# Patient Record
Sex: Female | Born: 1980 | Race: White | Hispanic: No | Smoking: Current every day smoker
Health system: Southern US, Community
[De-identification: ages and names within clinical notes are randomized; demographics above are authoritative.]

## PROBLEM LIST (undated history)

## (undated) DIAGNOSIS — F419 Anxiety disorder, unspecified: Secondary | ICD-10-CM

---

## 2001-07-25 ENCOUNTER — Other Ambulatory Visit: Admission: RE | Admit: 2001-07-25 | Discharge: 2001-07-25 | Payer: Self-pay | Admitting: Obstetrics and Gynecology

## 2003-01-20 ENCOUNTER — Encounter: Payer: Self-pay | Admitting: *Deleted

## 2003-01-20 ENCOUNTER — Emergency Department (HOSPITAL_COMMUNITY): Admission: EM | Admit: 2003-01-20 | Discharge: 2003-01-20 | Payer: Self-pay | Admitting: *Deleted

## 2003-06-29 ENCOUNTER — Inpatient Hospital Stay (HOSPITAL_COMMUNITY): Admission: EM | Admit: 2003-06-29 | Discharge: 2003-07-01 | Payer: Self-pay | Admitting: Psychiatry

## 2006-01-22 ENCOUNTER — Inpatient Hospital Stay (HOSPITAL_COMMUNITY): Admission: AD | Admit: 2006-01-22 | Discharge: 2006-01-26 | Payer: Self-pay | Admitting: Obstetrics and Gynecology

## 2006-01-23 ENCOUNTER — Encounter (INDEPENDENT_AMBULATORY_CARE_PROVIDER_SITE_OTHER): Payer: Self-pay | Admitting: Specialist

## 2006-01-29 ENCOUNTER — Ambulatory Visit: Admission: RE | Admit: 2006-01-29 | Discharge: 2006-01-29 | Payer: Self-pay | Admitting: Obstetrics and Gynecology

## 2010-05-10 ENCOUNTER — Ambulatory Visit (HOSPITAL_COMMUNITY): Admission: RE | Admit: 2010-05-10 | Discharge: 2010-05-10 | Payer: Self-pay | Admitting: Internal Medicine

## 2010-11-25 NOTE — Op Note (Signed)
Paula Paula [name], Paula [name]               ACCOUNT NO.:  [id number]   MEDICAL RECORD NO.:  [id number]          PATIENT TYPE:  INP   LOCATION:  9126                          FACILITY:  [facility]   PHYSICIAN:  Juluis Mire, M.D.   DATE OF BIRTH:  1980/08/24   DATE OF PROCEDURE:  01/23/2006  DATE OF DISCHARGE:                                 OPERATIVE REPORT   PREOPERATIVE DIAGNOSIS:  Intrauterine pregnancy at term with  oligohydramnios.  Induction of labor.  Face presentation.   POSTOPERATIVE DIAGNOSIS:  Intrauterine pregnancy at term with  oligohydramnios.  Induction of labor.  Face presentation.   OPERATION PERFORMED:  Low transverse cesarean section.   SURGEON:  Juluis Mire, M.D.   ANESTHESIA:  Spinal.   ESTIMATED BLOOD LOSS:  500 mL.   PACKS AND DRAINS:  None.   INTRAOPERATIVE BLOOD REPLACEMENT:  None.   COMPLICATIONS:  None.   INDICATIONS FOR PROCEDURE:  The patient is a 30 year old prima gravida  female at 40+ weeks, presented to the office.  Ultrasound revealed  oligohydramnios.  Was brought in for induction of labor.  Did have positive  group B strep.  Progressed to approximately 2 to 3 cm dilatation.  Found to  have a face presentation with the mentum posterior.  After discussion of  options, we decided to proceed with a primary cesarean section.  Risks were  discussed including risks of infection.  The risk of hemorrhage could  require transfusion with risks of AIDS or hepatitis.  The risk of injury to  adjacent organs including bladder, bowel or ureters that could require  further exploratory surgery.  Risks of deep venous thrombosis and pulmonary  embolus.  The patient understands indications and risks.   DESCRIPTION OF PROCEDURE:  The patient was taken to the operating room  placed supine position with left lateral tilt.  After satisfactory level of  spinal anesthesia was obtained, the abdomen was prepped out with Betadine  and draped in sterile field.  Low transverse  skin incision was made with a  knife and carried through subcutaneous tissue.  Fascia was entered sharply  and incision in fascia extended laterally.  Fascia taken off the muscle  superiorly and inferiorly.  Rectus muscles were separated in the midline.  The peritoneum was entered.  Incision in the peritoneum was extended both  superiorly and inferiorly.  Low transverse bladder flap was developed.  Low  transverse uterine incision begun with a knife and extended laterally using  manual traction.  The infant was in the face presentation and was delivered  with elevation of the head and fundal pressure.  Infant was a viable female.  Apgars were 8 and 9.  Umbilical artery pH was 7.31.  There were some slight  lacerations to the forehead from the amni-hook.  At this point the placenta  was delivered manually and sent for pathologic review.  It appeared to be  normal.  Uterus was exteriorized for closure.  There were no uterine  abnormalities.  Tubes and ovaries were unremarkable.  Uterus was then closed  with running locking sutures of 0  chromic using a two layered closure  technique.  We had good hemostasis and clear urine output.  The uterus was  returned to the abdominal cavity.  At this point muscle was reapproximated  with running suture of 3-0 Vicryl.  Fascia was closed with a running suture  of 0 PDS.  Skin was closed with staples and Steri-Strips.  Sponge, needle  and instrument counts were reported as correct by circulating nurse x2.  Foley catheter remained clear at the time of closure.  Patient tolerated the  procedure well and was returned to the recovery room in good condition.      Juluis Mire, M.D.  Electronically Signed     JSM/MEDQ  D:  01/23/2006  T:  01/24/2006  Job:  [id number]
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
## 2010-11-25 NOTE — Discharge Summary (Signed)
Paula [name], Paula [name]               ACCOUNT NO.:  [id number]   MEDICAL RECORD NO.:  [id number]          PATIENT TYPE:  INP   LOCATION:  9126                          FACILITY:  [facility]   PHYSICIAN:  Freddy Finner, M.D.   DATE OF BIRTH:  20-Sep-1980   DATE OF ADMISSION:  01/22/2006  DATE OF DISCHARGE:  01/26/2006                                 DISCHARGE SUMMARY   ADMITTING DIAGNOSES:  1. Intrauterine pregnancy at 40-2/7 weeks' estimated gestational age.  2. Oligohydramnios.  3. Induction of labor.   DISCHARGE DIAGNOSES:  1. Status post low transverse cesarean section secondary to face      presentation.  2. Viable female infant.   PROCEDURE:  Primary low transverse cesarean section.   REASON FOR ADMISSION:  Please see written H&P.   HOSPITAL COURSE:  The patient was a 24-year primigravida that was admitted  to [facility] at 40-2/7 weeks' estimated gestational age  for an induction of labor.  The patient had been seen in the office and had  had an ultrasound which had revealed oligohydramnios.  The patient was also  known to have positive group B beta strep.  The patient did progress to 2-  to 3-cm dilated, at which time she was found to have a face presentation  with a mentum in the posterior position.  The patient had also been  administer IV antibiotics.  Due to face presentation, decision was made to  proceed with a primary low transverse cesarean section.  The patient was  then transferred to the operating room, where spinal anesthesia was  administered without difficulty.  A low transverse incision was made with  delivery of a viable female infant weighing 6 pounds 8 ounces, Apgar scores  of 8 at 1 minute and 9 at 5 minutes.  Arterial cord pH was 7.31.  The  patient tolerated the procedure well and taken to the recovery room in  stable condition.  On postoperative day #1, the patient was without  complaint.  Vital signs were stable.  She was afebrile.  Fundus  was firm and  nontender.  Abdominal dressing was noted to be clean, dry and intact.  Laboratory findings revealed hemoglobin of 12.1, platelet count of 225,000,  WBC count of 16.9.  On postoperative day #2, the patient did complain of  some soreness.  Vital signs were stable.  She was afebrile.  Abdomen was  soft.  Fundus was firm and nontender.  Abdominal dressing had been removed,  revealing incision that was clean, dry and intact.  The patient was  ambulating well and tolerating a regular diet without complaints of nausea  or vomiting.  On postoperative day #3, the patient was without complaint.  Vital signs remained stable.  She was afebrile.  Abdomen was soft.  Fundus  was firm and nontender.  Incision was clean, dry and intact.  Discharge  instructions reviewed and the patient was discharged home.   CONDITION ON DISCHARGE:  Good.   DIET:  Regular as tolerated.   ATTENDING:  No heavy lifting.  No driving x2 weeks.  No  vaginal entry.   FOLLOWUP:  The patient is to follow up in the office in 2 weeks for an  incision check.  She is to call for temperature greater than 100 degrees,  persistent nausea and vomiting, heavy vaginal bleeding and/or redness or  drainage from the incisional site.   DISCHARGE MEDICATIONS:  1. Percocet 5/325, #30, one p.o. every 4-6 hours p.r.n..  2. Motrin 600 mg every 6 hours p.r.n.  3. Prenatal vitamins one p.o. daily.  4. Colace p.o. daily p.r.n.      Julio Sicks, N.P.      Freddy Finner, M.D.  Electronically Signed    CC/MEDQ  D:  02/25/2006  T:  02/26/2006  Job:  [id number]

## 2012-03-19 ENCOUNTER — Encounter (HOSPITAL_COMMUNITY): Payer: Self-pay

## 2012-03-19 ENCOUNTER — Emergency Department (INDEPENDENT_AMBULATORY_CARE_PROVIDER_SITE_OTHER)
Admission: EM | Admit: 2012-03-19 | Discharge: 2012-03-19 | Disposition: A | Discharge status: Home / Self Care | Payer: Self-pay | Source: Home / Self Care

## 2012-03-19 DIAGNOSIS — L03019 Cellulitis of unspecified finger: Secondary | ICD-10-CM

## 2012-03-19 DIAGNOSIS — IMO0002 Reserved for concepts with insufficient information to code with codable children: Secondary | ICD-10-CM

## 2012-03-19 HISTORY — DX: Anxiety disorder, unspecified: F41.9

## 2012-03-19 MED ORDER — OXYCODONE-ACETAMINOPHEN 5-325 MG PO TABS: 1.0000 | ORAL_TABLET | Freq: Four times a day (QID) | ORAL | Status: AC | PRN

## 2012-03-19 MED ORDER — DOXYCYCLINE HYCLATE 100 MG PO CAPS: 100.0000 mg | ORAL_CAPSULE | Freq: Two times a day (BID) | ORAL | Status: AC

## 2012-03-19 MED ORDER — HYDROCODONE-ACETAMINOPHEN 5-325 MG PO TABS: 1.0000 | ORAL_TABLET | ORAL | Status: DC | PRN
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
## 2012-03-19 NOTE — ED Notes (Signed)
Rt middle finger is swollen, red and has yellow pus noted to nail area.

## 2012-03-21 ENCOUNTER — Emergency Department (HOSPITAL_COMMUNITY): Payer: Self-pay

## 2012-03-21 ENCOUNTER — Encounter (HOSPITAL_COMMUNITY): Payer: Self-pay | Admitting: Emergency Medicine

## 2012-03-21 ENCOUNTER — Emergency Department (HOSPITAL_COMMUNITY)
Admission: EM | Admit: 2012-03-21 | Discharge: 2012-03-21 | Discharge status: Against Medical Advice | Payer: Self-pay | Attending: Emergency Medicine | Admitting: Emergency Medicine

## 2012-03-21 DIAGNOSIS — IMO0002 Reserved for concepts with insufficient information to code with codable children: Secondary | ICD-10-CM | POA: Insufficient documentation

## 2012-03-21 DIAGNOSIS — F411 Generalized anxiety disorder: Secondary | ICD-10-CM | POA: Insufficient documentation

## 2012-03-21 DIAGNOSIS — F17200 Nicotine dependence, unspecified, uncomplicated: Secondary | ICD-10-CM | POA: Insufficient documentation
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
## 2012-03-23 LAB — CULTURE, ROUTINE-ABSCESS

## 2012-03-23 NOTE — ED Provider Notes (Signed)
Medical screening examination/treatment/procedure(s) were performed by resident physician or non-physician practitioner and as supervising physician I was immediately available for consultation/collaboration.   Barkley Bruns MD.    Linna Hoff, MD 03/23/12 1052

## 2014-09-24 ENCOUNTER — Ambulatory Visit (INDEPENDENT_AMBULATORY_CARE_PROVIDER_SITE_OTHER): Payer: Self-pay | Admitting: Family Medicine

## 2014-09-24 ENCOUNTER — Ambulatory Visit (INDEPENDENT_AMBULATORY_CARE_PROVIDER_SITE_OTHER): Payer: Self-pay

## 2014-09-24 VITALS — BP 110/64 | HR 109 | Temp 102.3°F | Resp 18 | Ht 65.0 in | Wt 140.0 lb

## 2014-09-24 DIAGNOSIS — R509 Fever, unspecified: Secondary | ICD-10-CM

## 2014-09-24 DIAGNOSIS — R05 Cough: Secondary | ICD-10-CM

## 2014-09-24 DIAGNOSIS — R059 Cough, unspecified: Secondary | ICD-10-CM

## 2014-09-24 DIAGNOSIS — J22 Unspecified acute lower respiratory infection: Secondary | ICD-10-CM

## 2014-09-24 DIAGNOSIS — Z720 Tobacco use: Secondary | ICD-10-CM

## 2014-09-24 DIAGNOSIS — R6889 Other general symptoms and signs: Secondary | ICD-10-CM

## 2014-09-24 DIAGNOSIS — J988 Other specified respiratory disorders: Secondary | ICD-10-CM

## 2014-09-24 MED ORDER — ALBUTEROL SULFATE HFA 108 (90 BASE) MCG/ACT IN AERS: 2.0000 | INHALATION_SPRAY | Freq: Four times a day (QID) | RESPIRATORY_TRACT | Status: AC | PRN

## 2014-09-24 MED ORDER — HYDROCODONE-HOMATROPINE 5-1.5 MG/5ML PO SYRP: 5.0000 mL | ORAL_SOLUTION | Freq: Every evening | ORAL | Status: DC | PRN

## 2014-09-24 MED ORDER — OSELTAMIVIR PHOSPHATE 75 MG PO CAPS: 75.0000 mg | ORAL_CAPSULE | Freq: Two times a day (BID) | ORAL | Status: DC

## 2014-09-24 MED ORDER — ACETAMINOPHEN 325 MG PO TABS
500.0000 mg | ORAL_TABLET | Freq: Once | ORAL | Status: AC
  Administered 2014-09-24: 500 mg via ORAL

## 2014-09-24 MED ORDER — AZITHROMYCIN 250 MG PO TABS: ORAL_TABLET | ORAL | Status: DC

## 2014-09-24 NOTE — Progress Notes (Signed)
Chief Complaint:  Chief Complaint  Patient presents with  . Fever  . Headache  . Shortness of Breath  . Generalized Body Aches    HPI: Paula [name] is a 34 y.o. female who is here for  3-5 day progression of worsening lower respiratory and upper respiratory symptoms, she has been having a cough, nasal congestion generalized body aches, headache along the temporal area, she had a fever last night, T max was 102, Taking ibuprofen and tylenol. This morning did not feel good, total lethargy, she has had poor by mouth intake. She has had some nausea. Denies any abdominal pain or diarrhea or rash. Denies any ear pain. She is a smoker, but has not been able to smoke during this illness except for one cigarette a day. She has had some shortness of breath, denies any wheezing or chest pain. Denies sore throat.  Past Medical History  Diagnosis Date  . Anxiety    Past Surgical History  Procedure Laterality Date  . Cesarean section     History   Social History  . Marital Status: Divorced    Spouse Name: N/A  . Number of Children: N/A  . Years of Education: N/A   Social History Main Topics  . Smoking status: Current Every Day Smoker -- 1.00 packs/day    Types: Cigarettes  . Smokeless tobacco: Not on file  . Alcohol Use: Yes     Comment: occasion  . Drug Use: No  . Sexual Activity: Not on file   Other Topics Concern  . None   Social History Narrative   No family history on file. No Known Allergies Prior to Admission medications   Medication Sig Start Date End Date Taking? Authorizing Provider  clonazePAM (KLONOPIN) 0.5 MG tablet Take 0.5 mg by mouth 3 (three) times daily as needed.    Historical Provider, MD  ibuprofen (ADVIL,MOTRIN) 200 MG tablet Take 800 mg by mouth every 6 (six) hours as needed. For paion    Historical Provider, MD     ROS: The patient denies  night sweats, unintentional weight loss, chest pain, palpitations, wheezing, dyspnea on exertion, nausea,  vomiting, abdominal pain, dysuria, hematuria, melena, numbness, or tingling.   All other systems have been reviewed and were otherwise negative with the exception of those mentioned in the HPI and as above.    PHYSICAL EXAM: Filed Vitals:   09/24/14 1805  BP: 110/64  Pulse: 109  Temp: 102.3 F (39.1 C)  Resp: 18   Filed Vitals:   09/24/14 1805  Height: 5\' 5"  (1.651 m)  Weight: 140 lb (63.504 kg)   Body mass index is 23.3 kg/(m^2).  General: Alert, no acute distress, tired appearing Caucasian female, all mucosa is slightly dry HEENT:  Normocephalic, atraumatic, oropharynx patent. EOMI, PERRLA Cardiovascular:  Regular rate and rhythm, no rubs murmurs or gallops.  Radial pulse intact. No pedal edema.  Respiratory: wheezes, rales,  + rhonchi.  No cyanosis, no use of accessory musculature GI: No organomegaly, abdomen is soft and non-tender, positive bowel sounds.  No masses. Skin: No rashes. Neurologic: Facial musculature symmetric. Psychiatric: Patient is appropriate throughout our interaction. Lymphatic: No cervical lymphadenopathy Musculoskeletal: Gait intact.   LABS: Results for orders placed or performed during the hospital encounter of 03/19/12  Culture, routine-abscess  Result Value Ref Range   Specimen Description ABSCESS FINGER RIGHT    Special Requests NONE    Gram Stain      FEW WBC PRESENT,BOTH PMN  AND MONONUCLEAR NO SQUAMOUS EPITHELIAL CELLS SEEN RARE GRAM NEGATIVE RODS   Culture      MULTIPLE ORGANISMS PRESENT, NONE PREDOMINANT Note: NO STAPHYLOCOCCUS AUREUS ISOLATED NO GROUP A STREP (S.PYOGENES) ISOLATED   Report Status 03/23/2012 FINAL      EKG/XRAY:   Primary read interpreted by Dr. Conley Rolls at [facility]. Questionable right middle lobe infiltrate, there is a patchy density on lateral view as well.    ASSESSMENT/PLAN: Encounter Diagnoses  Name Primary?  . Fever, unspecified fever cause   . Cough Yes  . Tobacco abuse   . Flu-like symptoms   . Lower  respiratory infection (e.g., bronchitis, pneumonia, pneumonitis, pulmonitis)    Pleasant 34 year old female who has a history of tobacco abuse who presents with flulike symptoms and also changes on x-ray consistent with bronchitis vs possible early  Pneumonia. He was given 1 dose of Tylenol 500 mg in the office and her temperature went down to 101.5 from 102. Advised to push fluids at home and also take alternating Tylenol and Motrin when necessary Will treat for Lower respiratory sxs c/w bronchitis  with Z-Pak Also has flu like sxs with treat with tamiflu Rx  Hycodan; take OTC Tildon Husky As she is uninsured so we did not to the flu swab instead just did a chest x-ray. Fu prn   Gross sideeffects, risk and benefits, and alternatives of medications d/w patient. Patient is aware that all medications have potential sideeffects and we are unable to predict every sideeffect or drug-drug interaction that may occur.  Hamilton Capri PHUONG, DO 09/24/2014 6:49 PM

## 2014-10-06 ENCOUNTER — Encounter: Payer: Self-pay | Admitting: Radiology

## 2014-10-17 ENCOUNTER — Telehealth: Payer: Self-pay | Admitting: *Deleted

## 2014-10-17 NOTE — Telephone Encounter (Signed)
Pt called regarding X-ray results.  Gave pt results

## 2015-12-01 ENCOUNTER — Ambulatory Visit (HOSPITAL_COMMUNITY)
Admission: EM | Admit: 2015-12-01 | Discharge: 2015-12-01 | Disposition: A | Discharge status: Home / Self Care | Payer: Medicaid Other | Attending: Family Medicine | Admitting: Family Medicine

## 2015-12-01 ENCOUNTER — Encounter (HOSPITAL_COMMUNITY): Payer: Self-pay | Admitting: Emergency Medicine

## 2015-12-01 DIAGNOSIS — J302 Other seasonal allergic rhinitis: Secondary | ICD-10-CM | POA: Diagnosis not present

## 2015-12-01 DIAGNOSIS — R0982 Postnasal drip: Secondary | ICD-10-CM | POA: Insufficient documentation

## 2015-12-01 DIAGNOSIS — H6691 Otitis media, unspecified, right ear: Secondary | ICD-10-CM | POA: Insufficient documentation

## 2015-12-01 DIAGNOSIS — R200 Anesthesia of skin: Secondary | ICD-10-CM | POA: Diagnosis not present

## 2015-12-01 DIAGNOSIS — F17210 Nicotine dependence, cigarettes, uncomplicated: Secondary | ICD-10-CM | POA: Diagnosis not present

## 2015-12-01 DIAGNOSIS — J029 Acute pharyngitis, unspecified: Secondary | ICD-10-CM | POA: Insufficient documentation

## 2015-12-01 DIAGNOSIS — F419 Anxiety disorder, unspecified: Secondary | ICD-10-CM | POA: Insufficient documentation

## 2015-12-01 LAB — POCT RAPID STREP A: Streptococcus, Group A Screen (Direct): NEGATIVE

## 2015-12-01 MED ORDER — AMOXICILLIN 500 MG PO CAPS: 1000.0000 mg | ORAL_CAPSULE | Freq: Two times a day (BID) | ORAL | Status: DC

## 2015-12-01 MED ORDER — IBUPROFEN 800 MG PO TABS: 800.0000 mg | ORAL_TABLET | Freq: Three times a day (TID) | ORAL | Status: DC

## 2015-12-01 NOTE — ED Notes (Signed)
[facility] with [facility] complaint of a headache, numbness in her right arm, and bilateral ear pain. The patient's husband stated that over the last month she has had extreme fatigue and lower leg circulation issues for the last month.

## 2015-12-01 NOTE — Discharge Instructions (Signed)
Allergic Rhinitis For nasal and head congestion may take Sudafed PE 10 mg every 4 hours as needed. Saline nasal spray used frequently. For drainage may use Allegra, Claritin or Zyrtec. If you need stronger medicine to stop drainage may take Chlor-Trimeton 2-4 mg every 4 hours. This may cause drowsiness. Ibuprofen 600 mg every 6 hours as needed for pain, discomfort or fever. Drink plenty of fluids and stay well-hydrated.  Allergic rhinitis is when the mucous membranes in the nose respond to allergens. Allergens are particles in the air that cause your body to have an allergic reaction. This causes you to release allergic antibodies. Through a chain of events, these eventually cause you to release histamine into the blood stream. Although meant to protect the body, it is this release of histamine that causes your discomfort, such as frequent sneezing, congestion, and an itchy, runny nose.  CAUSES Seasonal allergic rhinitis (hay fever) is caused by pollen allergens that may come from grasses, trees, and weeds. Year-round allergic rhinitis (perennial allergic rhinitis) is caused by allergens such as house dust mites, pet dander, and mold spores. SYMPTOMS  Nasal stuffiness (congestion).  Itchy, runny nose with sneezing and tearing of the eyes. DIAGNOSIS Your health care provider can help you determine the allergen or allergens that trigger your symptoms. If you and your health care provider are unable to determine the allergen, skin or blood testing may be used. Your health care provider will diagnose your condition after taking your health history and performing a physical exam. Your health care provider may assess you for other related conditions, such as asthma, pink eye, or an ear infection. TREATMENT Allergic rhinitis does not have a cure, but it can be controlled by:  Medicines that block allergy symptoms. These may include allergy shots, nasal sprays, and oral antihistamines.  Avoiding the  allergen. Hay fever may often be treated with antihistamines in pill or nasal spray forms. Antihistamines block the effects of histamine. There are over-the-counter medicines that may help with nasal congestion and swelling around the eyes. Check with your health care provider before taking or giving this medicine. If avoiding the allergen or the medicine prescribed do not work, there are many new medicines your health care provider can prescribe. Stronger medicine may be used if initial measures are ineffective. Desensitizing injections can be used if medicine and avoidance does not work. Desensitization is when a patient is given ongoing shots until the body becomes less sensitive to the allergen. Make sure you follow up with your health care provider if problems continue. HOME CARE INSTRUCTIONS It is not possible to completely avoid allergens, but you can reduce your symptoms by taking steps to limit your exposure to them. It helps to know exactly what you are allergic to so that you can avoid your specific triggers. SEEK MEDICAL CARE IF:  You have a fever.  You develop a cough that does not stop easily (persistent).  You have shortness of breath.  You start wheezing.  Symptoms interfere with normal daily activities.   This information is not intended to replace advice given to you by your health care provider. Make sure you discuss any questions you have with your health care provider.   Document Released: 03/21/2001 Document Revised: 07/17/2014 Document Reviewed: 03/03/2013 Elsevier Interactive Patient Education 2016 [employer].  Otitis Media, Adult Otitis media is redness, soreness, and inflammation of the middle ear. Otitis media may be caused by allergies or, most commonly, by infection. Often it occurs as a complication  of the common cold. SIGNS AND SYMPTOMS Symptoms of otitis media may include:  Earache.  Fever.  Ringing in your ear.  Headache.  Leakage of fluid from  the ear. DIAGNOSIS To diagnose otitis media, your health care provider will examine your ear with an otoscope. This is an instrument that allows your health care provider to see into your ear in order to examine your eardrum. Your health care provider also will ask you questions about your symptoms. TREATMENT  Typically, otitis media resolves on its own within 3-5 days. Your health care provider may prescribe medicine to ease your symptoms of pain. If otitis media does not resolve within 5 days or is recurrent, your health care provider may prescribe antibiotic medicines if he or she suspects that a bacterial infection is the cause. HOME CARE INSTRUCTIONS   If you were prescribed an antibiotic medicine, finish it all even if you start to feel better.  Take medicines only as directed by your health care provider.  Keep all follow-up visits as directed by your health care provider. SEEK MEDICAL CARE IF:  You have otitis media only in one ear, or bleeding from your nose, or both.  You notice a lump on your neck.  You are not getting better in 3-5 days.  You feel worse instead of better. SEEK IMMEDIATE MEDICAL CARE IF:   You have pain that is not controlled with medicine.  You have swelling, redness, or pain around your ear or stiffness in your neck.  You notice that part of your face is paralyzed.  You notice that the bone behind your ear (mastoid) is tender when you touch it. MAKE SURE YOU:   Understand these instructions.  Will watch your condition.  Will get help right away if you are not doing well or get worse.   This information is not intended to replace advice given to you by your health care provider. Make sure you discuss any questions you have with your health care provider.   Document Released: 03/31/2004 Document Revised: 07/17/2014 Document Reviewed: 01/21/2013 Elsevier Interactive Patient Education [employer] Inc.

## 2015-12-01 NOTE — ED Provider Notes (Signed)
CSN: [id number]     Arrival date & time 12/01/15  1953 History   First MD Initiated Contact with Patient 12/01/15 2015     Chief Complaint  Patient presents with  . Headache  . Numbness  . Otalgia   (Consider location/radiation/quality/duration/timing/severity/associated sxs/prior Treatment) HPI Comments: 35 year old female presents with acute onset of sore throat, neck pain, right earache, headache and right hand numbness since last PM.  Her second concern in which her boyfriend wanted her to speak of involves swelling of the lower extremities at the end of her work shift, fatigue for a month.   Past Medical History  Diagnosis Date  . Anxiety    Past Surgical History  Procedure Laterality Date  . Cesarean section     History reviewed. No pertinent family history. Social History  Substance Use Topics  . Smoking status: Current Every Day Smoker -- 1.00 packs/day    Types: Cigarettes  . Smokeless tobacco: None  . Alcohol Use: Yes     Comment: occasion   OB History    No data available     Review of Systems  Constitutional: Positive for fever, activity change and fatigue.  HENT: Positive for congestion, postnasal drip, rhinorrhea and sore throat.   Eyes: Negative.   Respiratory: Positive for cough. Negative for shortness of breath.   Cardiovascular: Positive for leg swelling. Negative for chest pain.  Gastrointestinal: Negative.   Musculoskeletal: Negative.   Skin: Negative.   Neurological: Positive for numbness and headaches. Negative for tremors, seizures, syncope, facial asymmetry and speech difficulty.    Allergies  Review of patient's allergies indicates no known allergies.  Home Medications   Prior to Admission medications   Medication Sig Start Date End Date Taking? Authorizing Provider  clonazePAM (KLONOPIN) 0.5 MG tablet Take 0.5 mg by mouth 3 (three) times daily as needed.   Yes Historical Provider, MD  escitalopram (LEXAPRO) 10 MG tablet Take 10 mg by  mouth daily.   Yes Historical Provider, MD  albuterol (PROVENTIL HFA;VENTOLIN HFA) 108 (90 BASE) MCG/ACT inhaler Inhale 2 puffs into the lungs every 6 (six) hours as needed for wheezing or shortness of breath. 09/24/14   Thao P Le, DO  amoxicillin (AMOXIL) 500 MG capsule Take 2 capsules (1,000 mg total) by mouth 2 (two) times daily. 12/01/15   Hayden Rasmussenavid Jonanthan Bolender, NP  azithromycin (ZITHROMAX) 250 MG tablet Take 2 tabs po now then 1 tab po daily for the next 4 days 09/24/14   Thao P Le, DO  HYDROcodone-homatropine (HYCODAN) 5-1.5 MG/5ML syrup Take 5 mLs by mouth at bedtime as needed. 09/24/14   Thao P Le, DO  ibuprofen (ADVIL,MOTRIN) 200 MG tablet Take 800 mg by mouth every 6 (six) hours as needed. For paion    Historical Provider, MD  oseltamivir (TAMIFLU) 75 MG capsule Take 1 capsule (75 mg total) by mouth 2 (two) times daily. 09/24/14   Thao P Le, DO   Meds Ordered and Administered this Visit  Medications - No data to display  BP 110/63 mmHg  Pulse 87  Temp(Src) 99.6 F (37.6 C) (Oral)  Resp 18  SpO2 98%  LMP 10/09/2015 (Approximate) No data found.   Physical Exam  Constitutional: She is oriented to person, place, and time. She appears well-developed and well-nourished. No distress.  HENT:  Head: Normocephalic and atraumatic.  Mouth/Throat: No oropharyngeal exudate.  Bilateral TMs are retracted. Right TM is deeply erythematous.  Oropharynx with deep erythema, cobblestoning and clear PND.  Eyes: Conjunctivae and EOM  are normal.  Neck: Normal range of motion. Neck supple.  Tenderness to the posterior cervical musculature. Tenderness over the bridge of the trapezius and splenius Type. No spinal tenderness. No neck stiffness. Full range of motion of the neck.  Cardiovascular: Normal rate, regular rhythm and normal heart sounds.   Pulmonary/Chest: Effort normal. No respiratory distress.  Somewhat diminished breath sounds. Patient states she smokes one pack per day. No adventitious sounds. No  wheezing or crackles.  Musculoskeletal: Normal range of motion. She exhibits no edema.  Lymphadenopathy:    She has no cervical adenopathy.  Neurological: She is alert and oriented to person, place, and time. No cranial nerve deficit. She exhibits normal muscle tone. Coordination normal.  Skin: Skin is warm and dry. No rash noted.  Psychiatric: She has a normal mood and affect.  Nursing note and vitals reviewed.   ED Course  Procedures (including critical care time)  Labs Review Labs Reviewed  POCT RAPID STREP A    Imaging Review No results found.   Visual Acuity Review  Right Eye Distance:   Left Eye Distance:   Bilateral Distance:    Right Eye Near:   Left Eye Near:    Bilateral Near:         MDM   1. Acute right otitis media, recurrence not specified, unspecified otitis media type   2. Other seasonal allergic rhinitis   3. Pharyngitis   4. PND (post-nasal drip)    Meds ordered this encounter  Medications  . escitalopram (LEXAPRO) 10 MG tablet    Sig: Take 10 mg by mouth daily.  Marland Kitchen amoxicillin (AMOXIL) 500 MG capsule    Sig: Take 2 capsules (1,000 mg total) by mouth 2 (two) times daily.    Dispense:  30 capsule    Refill:  0    Order Specific Question:  Supervising Provider    Answer:  Linna Hoff [id number]   For nasal and head congestion may take Sudafed PE 10 mg every 4 hours as needed. Saline nasal spray used frequently. For drainage may use Allegra, Claritin or Zyrtec. If you need stronger medicine to stop drainage may take Chlor-Trimeton 2-4 mg every 4 hours. This may cause drowsiness. Ibuprofen 600 mg every 6 hours as needed for pain, discomfort or fever. Drink plenty of fluids and stay well-hydrated. Follow-up with your doctor as needed for evaluation of afternoon leg swelling and fatigue.    Hayden Rasmussen, NP 12/01/15 [id number]  Hayden Rasmussen, NP 12/01/15 [phone]

## 2015-12-03 LAB — CULTURE, GROUP A STREP (THRC)

## 2015-12-12 ENCOUNTER — Ambulatory Visit (HOSPITAL_COMMUNITY)
Admission: EM | Admit: 2015-12-12 | Discharge: 2015-12-12 | Disposition: A | Discharge status: Home / Self Care | Payer: Medicaid Other | Attending: Family Medicine | Admitting: Family Medicine

## 2015-12-12 ENCOUNTER — Encounter (HOSPITAL_COMMUNITY): Payer: Self-pay | Admitting: Emergency Medicine

## 2015-12-12 DIAGNOSIS — H6691 Otitis media, unspecified, right ear: Secondary | ICD-10-CM | POA: Insufficient documentation

## 2015-12-12 DIAGNOSIS — F17210 Nicotine dependence, cigarettes, uncomplicated: Secondary | ICD-10-CM | POA: Diagnosis not present

## 2015-12-12 DIAGNOSIS — J029 Acute pharyngitis, unspecified: Secondary | ICD-10-CM

## 2015-12-12 DIAGNOSIS — R202 Paresthesia of skin: Secondary | ICD-10-CM | POA: Diagnosis not present

## 2015-12-12 DIAGNOSIS — Z79899 Other long term (current) drug therapy: Secondary | ICD-10-CM | POA: Diagnosis not present

## 2015-12-12 DIAGNOSIS — F419 Anxiety disorder, unspecified: Secondary | ICD-10-CM | POA: Diagnosis not present

## 2015-12-12 LAB — POCT RAPID STREP A: STREPTOCOCCUS, GROUP A SCREEN (DIRECT): NEGATIVE

## 2015-12-12 MED ORDER — AZITHROMYCIN 250 MG PO TABS: ORAL_TABLET | ORAL | Status: DC

## 2015-12-12 MED ORDER — IBUPROFEN 400 MG PO TABS: 400.0000 mg | ORAL_TABLET | Freq: Four times a day (QID) | ORAL | Status: DC | PRN

## 2015-12-12 NOTE — ED Provider Notes (Signed)
CSN: [id number]     Arrival date & time 12/12/15  1942 History   First MD Initiated Contact with Patient 12/12/15 2011     Chief Complaint  Patient presents with  . Otalgia  . Sore Throat   (Consider location/radiation/quality/duration/timing/severity/associated sxs/prior Treatment) Patient is a 35 y.o. female presenting with ear pain and pharyngitis. The history is provided by the patient. No language interpreter was used.  Otalgia Location:  Right Quality:  Aching Severity:  Moderate Onset quality:  Gradual Duration:  1 day Timing:  Constant Progression:  Unchanged Chronicity:  New Context: not direct blow and not elevation change   Relieved by:  Nothing Worsened by:  Nothing tried Ineffective treatments:  OTC medications Associated symptoms: headaches, rhinorrhea and sore throat   Associated symptoms: no abdominal pain, no cough, no ear discharge, no fever, no hearing loss, no neck pain, no rash, no tinnitus and no vomiting   Associated symptoms comment:  Occasional tingling in her right hand especially when sleeping at night Sore Throat This is a recurrent (She had throat pain 2 wks ago and was started on amoxicillin. Pain restarted again yesterday) problem. The current episode started yesterday. The problem occurs constantly. The problem has not changed since onset.Associated symptoms include headaches. Pertinent negatives include no abdominal pain and no shortness of breath. Associated symptoms comments: Occasional soreness in her chest. Nothing aggravates the symptoms. Nothing relieves the symptoms. Treatments tried: NSAID. The treatment provided no relief.    Past Medical History  Diagnosis Date  . Anxiety    Past Surgical History  Procedure Laterality Date  . Cesarean section     History reviewed. No pertinent family history. Social History  Substance Use Topics  . Smoking status: Current Every Day Smoker -- 1.00 packs/day    Types: Cigarettes  . Smokeless tobacco:  None  . Alcohol Use: Yes     Comment: occasion   OB History    No data available     Review of Systems  Constitutional: Negative for fever.  HENT: Positive for ear pain, rhinorrhea and sore throat. Negative for ear discharge, hearing loss and tinnitus.   Respiratory: Negative.  Negative for cough and shortness of breath.   Cardiovascular: Negative.   Gastrointestinal: Negative for vomiting and abdominal pain.  Genitourinary: Negative.   Musculoskeletal: Negative for neck pain.  Skin: Negative for rash.  Neurological: Positive for headaches.  All other systems reviewed and are negative.   Allergies  Review of patient's allergies indicates no known allergies.  Home Medications   Prior to Admission medications   Medication Sig Start Date End Date Taking? Authorizing Provider  amoxicillin (AMOXIL) 500 MG capsule Take 2 capsules (1,000 mg total) by mouth 2 (two) times daily. 12/01/15  Yes Hayden Rasmussenavid Mabe, NP  clonazePAM (KLONOPIN) 0.5 MG tablet Take 0.5 mg by mouth 3 (three) times daily as needed.   Yes Historical Provider, MD  escitalopram (LEXAPRO) 10 MG tablet Take 10 mg by mouth daily.   Yes Historical Provider, MD  albuterol (PROVENTIL HFA;VENTOLIN HFA) 108 (90 BASE) MCG/ACT inhaler Inhale 2 puffs into the lungs every 6 (six) hours as needed for wheezing or shortness of breath. 09/24/14   Thao P Le, DO  azithromycin (ZITHROMAX) 250 MG tablet Take 2 tabs po now then 1 tab po daily for the next 4 days 09/24/14   Thao P Le, DO  HYDROcodone-homatropine (HYCODAN) 5-1.5 MG/5ML syrup Take 5 mLs by mouth at bedtime as needed. 09/24/14   Thao P Le,  DO  ibuprofen (ADVIL,MOTRIN) 800 MG tablet Take 1 tablet (800 mg total) by mouth 3 (three) times daily. 12/01/15   Hayden Rasmussen, NP  oseltamivir (TAMIFLU) 75 MG capsule Take 1 capsule (75 mg total) by mouth 2 (two) times daily. 09/24/14   Thao P Le, DO   Meds Ordered and Administered this Visit  Medications - No data to display  BP 100/55 mmHg  Pulse 94   Temp(Src) 97.6 F (36.4 C) (Oral)  Resp 16  SpO2 100%  LMP 12/06/2015 (Exact Date) No data found.   Physical Exam  Constitutional: She is oriented to person, place, and time. She appears well-developed. No distress.  HENT:  Head: Normocephalic.  Right Ear: No drainage or swelling. Tympanic membrane is erythematous and bulging. A middle ear effusion is present.  Left Ear: Hearing, tympanic membrane, external ear and ear canal normal.  Mouth/Throat: Uvula is midline and mucous membranes are normal. Posterior oropharyngeal erythema present. No oropharyngeal exudate, posterior oropharyngeal edema or tonsillar abscesses.  Eyes: Conjunctivae and EOM are normal. Pupils are equal, round, and reactive to light.  Cardiovascular: Normal rate and regular rhythm.   No murmur heard. Pulmonary/Chest: Effort normal and breath sounds normal. No respiratory distress. She has no wheezes.  Abdominal: Soft. Bowel sounds are normal. She exhibits no distension and no mass. There is no tenderness.  Musculoskeletal: Normal range of motion. She exhibits no edema.  Lymphadenopathy:    She has no cervical adenopathy.    She has no axillary adenopathy.  Neurological: She is alert and oriented to person, place, and time. She has normal strength. No cranial nerve deficit or sensory deficit. Coordination normal.  Nursing note and vitals reviewed.   ED Course  Procedures (including critical care time)  Labs Review Labs Reviewed - No data to display  Imaging Review No results found.   Visual Acuity Review  Right Eye Distance:   Left Eye Distance:   Bilateral Distance:    Right Eye Near:   Left Eye Near:    Bilateral Near:         MDM  No diagnosis found. Right otitis media, recurrence not specified, unspecified chronicity, unspecified otitis media type  Pharyngitis  Paresthesia   Since patient recently completed Amoxicillin for pharyngitis, I gave her Zithromax instead for ear  infection. Rapid strep test is negative.  I recommended Ibuprofen prn pain. For her right arm intermittent paresthesia, no neurologic deficit based on my assessment. It could be the way she sleeps on her arm. I recommended follow up with PCP soon for possible neurology referral. She verbalized understanding and agreed with plan.    Doreene Eland, MD 12/12/15 2030

## 2015-12-12 NOTE — Discharge Instructions (Signed)
It was nice seeing you. It seems you have ear infection. Since you recently completed Amoxicillin, I will have you use Zithromax instead. Your strep infection test is negative. Use ibuprofen as needed for pain. See your PCP soon to reassess your nerve pain on your right hand. Follow up as needed. Otitis Media, Adult Otitis media is redness, soreness, and inflammation of the middle ear. Otitis media may be caused by allergies or, most commonly, by infection. Often it occurs as a complication of the common cold. SIGNS AND SYMPTOMS Symptoms of otitis media may include:  Earache.  Fever.  Ringing in your ear.  Headache.  Leakage of fluid from the ear. DIAGNOSIS To diagnose otitis media, your health care provider will examine your ear with an otoscope. This is an instrument that allows your health care provider to see into your ear in order to examine your eardrum. Your health care provider also will ask you questions about your symptoms. TREATMENT  Typically, otitis media resolves on its own within 3-5 days. Your health care provider may prescribe medicine to ease your symptoms of pain. If otitis media does not resolve within 5 days or is recurrent, your health care provider may prescribe antibiotic medicines if he or she suspects that a bacterial infection is the cause. HOME CARE INSTRUCTIONS   If you were prescribed an antibiotic medicine, finish it all even if you start to feel better.  Take medicines only as directed by your health care provider.  Keep all follow-up visits as directed by your health care provider. SEEK MEDICAL CARE IF:  You have otitis media only in one ear, or bleeding from your nose, or both.  You notice a lump on your neck.  You are not getting better in 3-5 days.  You feel worse instead of better. SEEK IMMEDIATE MEDICAL CARE IF:   You have pain that is not controlled with medicine.  You have swelling, redness, or pain around your ear or stiffness in your  neck.  You notice that part of your face is paralyzed.  You notice that the bone behind your ear (mastoid) is tender when you touch it. MAKE SURE YOU:   Understand these instructions.  Will watch your condition.  Will get help right away if you are not doing well or get worse.   This information is not intended to replace advice given to you by your health care provider. Make sure you discuss any questions you have with your health care provider.   Document Released: 03/31/2004 Document Revised: 07/17/2014 Document Reviewed: 01/21/2013 Elsevier Interactive Patient Education [employer]2016 Elsevier Inc.

## 2015-12-12 NOTE — ED Notes (Signed)
[facility] with a complaint of right ear pain and a sore throat x 2 days.

## 2015-12-16 LAB — CULTURE, GROUP A STREP (THRC)

## 2015-12-21 ENCOUNTER — Telehealth (HOSPITAL_COMMUNITY): Payer: Self-pay | Admitting: Emergency Medicine

## 2015-12-21 NOTE — ED Notes (Signed)
Called but VM was full [phone]) Need to give lab results from recent visit on 6/4  Per Dr. Dayton ScrapeMurray,  Notes Recorded by Eustace MooreLaura W Murray, MD on 12/16/2015 at 5:14 PM Please let patient know that throat cx was positive for a few non-group A strep. No change in treatment required. Recheck for further evaluation if symptoms persist. LM Notes Recorded by Eustace MooreLaura W Murray, MD on 12/15/2015 at 2:31 AM [facility]; recent course of amoxicillin given 12/01/15. LM

## 2016-01-03 ENCOUNTER — Encounter (HOSPITAL_COMMUNITY): Payer: Self-pay | Admitting: Emergency Medicine

## 2016-01-03 ENCOUNTER — Ambulatory Visit (HOSPITAL_COMMUNITY)
Admission: EM | Admit: 2016-01-03 | Discharge: 2016-01-03 | Discharge status: Against Medical Advice | Payer: Medicaid Other | Attending: Family Medicine | Admitting: Family Medicine

## 2016-01-03 DIAGNOSIS — R109 Unspecified abdominal pain: Secondary | ICD-10-CM

## 2016-01-03 NOTE — ED Provider Notes (Signed)
CSN: [id number]     Arrival date & time 01/03/16  1947 History   First MD Initiated Contact with Patient 01/03/16 2022     Chief Complaint  Patient presents with  . Fever  . Abdominal Pain   (Consider location/radiation/quality/duration/timing/severity/associated sxs/prior Treatment) Patient is a 35 y.o. female presenting with fever and abdominal pain. The history is provided by the patient.  Fever Severity:  Mild Chronicity:  Recurrent Relieved by:  None tried Worsened by:  Nothing tried Ineffective treatments:  None tried Associated symptoms: chills   Associated symptoms: no congestion, no cough, no dysuria, no nausea, no rash, no sore throat and no vomiting   Abdominal Pain Associated symptoms: chills and fever   Associated symptoms: no cough, no dysuria, no nausea, no shortness of breath, no sore throat and no vomiting     Past Medical History  Diagnosis Date  . Anxiety    Past Surgical History  Procedure Laterality Date  . Cesarean section     History reviewed. No pertinent family history. Social History  Substance Use Topics  . Smoking status: Current Every Day Smoker -- 1.00 packs/day    Types: Cigarettes  . Smokeless tobacco: None  . Alcohol Use: Yes     Comment: occasion   OB History    No data available     Review of Systems  Constitutional: Positive for fever and chills.  HENT: Positive for postnasal drip. Negative for congestion and sore throat.   Respiratory: Negative for cough, shortness of breath and wheezing.   Cardiovascular: Negative.   Gastrointestinal: Positive for abdominal pain. Negative for nausea and vomiting.  Genitourinary: Negative.  Negative for dysuria.  Skin: Negative for rash.  All other systems reviewed and are negative.   Allergies  Review of patient's allergies indicates no known allergies.  Home Medications   Prior to Admission medications   Medication Sig Start Date End Date Taking? Authorizing Provider  clonazePAM  (KLONOPIN) 0.5 MG tablet Take 0.5 mg by mouth 3 (three) times daily as needed.   Yes Historical Provider, MD  escitalopram (LEXAPRO) 10 MG tablet Take 10 mg by mouth daily.   Yes Historical Provider, MD  albuterol (PROVENTIL HFA;VENTOLIN HFA) 108 (90 BASE) MCG/ACT inhaler Inhale 2 puffs into the lungs every 6 (six) hours as needed for wheezing or shortness of breath. 09/24/14   Thao P Le, DO  amoxicillin (AMOXIL) 500 MG capsule Take 2 capsules (1,000 mg total) by mouth 2 (two) times daily. 12/01/15   Hayden Rasmussenavid Mabe, NP  azithromycin (ZITHROMAX) 250 MG tablet Take two tablet today. Then take one tablet daily for 4 days 12/12/15   Doreene ElandKehinde T Eniola, MD  HYDROcodone-homatropine [facility](HYCODAN) 5-1.5 MG/5ML syrup Take 5 mLs by mouth at bedtime as needed. 09/24/14   Thao P Le, DO  ibuprofen (ADVIL,MOTRIN) 400 MG tablet Take 1 tablet (400 mg total) by mouth every 6 (six) hours as needed. 12/12/15   Doreene ElandKehinde T Eniola, MD  oseltamivir (TAMIFLU) 75 MG capsule Take 1 capsule (75 mg total) by mouth 2 (two) times daily. 09/24/14   Thao P Le, DO   Meds Ordered and Administered this Visit  Medications - No data to display  BP 128/76 mmHg  Pulse 84  Temp(Src) 97.8 F (36.6 C) (Oral)  Resp 18  SpO2 100%  LMP 12/06/2015 (Exact Date) No data found.   Physical Exam  Constitutional: She is oriented to person, place, and time. She appears well-developed and well-nourished. No distress.  HENT:  Right Ear:  External ear normal.  Left Ear: External ear normal.  Mouth/Throat: Oropharynx is clear and moist.  Eyes: Pupils are equal, round, and reactive to light.  Neck: Normal range of motion. Neck supple.  Cardiovascular: Normal rate, normal heart sounds and intact distal pulses.   Pulmonary/Chest: Breath sounds normal.  Abdominal: Soft. Bowel sounds are normal. She exhibits no distension. There is tenderness in the left upper quadrant. There is no rigidity and no CVA tenderness.  Lymphadenopathy:    She has no cervical  adenopathy.  Neurological: She is alert and oriented to person, place, and time.  Skin: Skin is warm and dry.  Nursing note and vitals reviewed.   ED Course  Procedures (including critical care time)  Labs Review Labs Reviewed - No data to display  Imaging Review No results found.   Visual Acuity Review  Right Eye Distance:   Left Eye Distance:   Bilateral Distance:    Right Eye Near:   Left Eye Near:    Bilateral Near:         MDM   1. Abdominal pain in female patient    Sent for eval of 1 mo of relapsing illness, has been on 2 diff abx.    Linna HoffJames D Kindl, MD 01/03/16 2043

## 2016-01-03 NOTE — ED Notes (Signed)
Patient stated that she did not wish to be transferred to the ED and walked out of the building. Patient left AMA and refused to sign paperwork.
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
## 2016-03-07 ENCOUNTER — Encounter (HOSPITAL_COMMUNITY): Payer: Self-pay

## 2016-03-07 ENCOUNTER — Emergency Department (HOSPITAL_COMMUNITY): Payer: Medicaid Other

## 2016-03-07 ENCOUNTER — Emergency Department (HOSPITAL_COMMUNITY)
Admission: EM | Admit: 2016-03-07 | Discharge: 2016-03-07 | Disposition: A | Discharge status: Home / Self Care | Payer: Medicaid Other | Attending: Emergency Medicine | Admitting: Emergency Medicine

## 2016-03-07 DIAGNOSIS — T1490XA Injury, unspecified, initial encounter: Secondary | ICD-10-CM

## 2016-03-07 DIAGNOSIS — F17210 Nicotine dependence, cigarettes, uncomplicated: Secondary | ICD-10-CM | POA: Insufficient documentation

## 2016-03-07 DIAGNOSIS — Y999 Unspecified external cause status: Secondary | ICD-10-CM | POA: Insufficient documentation

## 2016-03-07 DIAGNOSIS — W1839XA Other fall on same level, initial encounter: Secondary | ICD-10-CM | POA: Insufficient documentation

## 2016-03-07 DIAGNOSIS — Y929 Unspecified place or not applicable: Secondary | ICD-10-CM | POA: Diagnosis not present

## 2016-03-07 DIAGNOSIS — Z79899 Other long term (current) drug therapy: Secondary | ICD-10-CM | POA: Diagnosis not present

## 2016-03-07 DIAGNOSIS — M25571 Pain in right ankle and joints of right foot: Secondary | ICD-10-CM

## 2016-03-07 DIAGNOSIS — Y9389 Activity, other specified: Secondary | ICD-10-CM | POA: Insufficient documentation

## 2016-03-07 DIAGNOSIS — S99911A Unspecified injury of right ankle, initial encounter: Secondary | ICD-10-CM | POA: Insufficient documentation

## 2016-03-07 MED ORDER — ONDANSETRON 4 MG PO TBDP
4.0000 mg | ORAL_TABLET | Freq: Once | ORAL | Status: AC
  Administered 2016-03-07: 4 mg via ORAL
  Filled 2016-03-07: qty 1

## 2016-03-07 MED ORDER — OXYCODONE-ACETAMINOPHEN 5-325 MG PO TABS
1.0000 | ORAL_TABLET | Freq: Once | ORAL | Status: AC
  Administered 2016-03-07: 1 via ORAL
  Filled 2016-03-07: qty 1

## 2016-03-07 MED ORDER — TRAMADOL HCL 50 MG PO TABS: 50.0000 mg | ORAL_TABLET | Freq: Four times a day (QID) | ORAL | 0 refills | Status: DC | PRN

## 2016-03-07 MED ORDER — NAPROXEN 375 MG PO TABS: 375.0000 mg | ORAL_TABLET | Freq: Two times a day (BID) | ORAL | 0 refills | Status: DC

## 2016-03-07 NOTE — ED Triage Notes (Signed)
Pt was playing kickball, rolled her r ankle and heard a crack. Pain is shooting up her leg. Swelling to outside of ankle, good pulse

## 2016-03-07 NOTE — ED Provider Notes (Signed)
[facility]-EMERGENCY DEPT Provider Note   CSN: [id number] Arrival date & time: 03/07/16  2055  By signing my name below, I, Christel Mormon, attest that this documentation has been prepared under the direction and in the presence of Kerrie Buffalo, NP Electronically Signed: Christel Mormon, Scribe. 03/07/2016. 9:56 PM.    History   Chief Complaint Chief Complaint  Patient presents with  . Ankle Injury    The history is provided by the patient. No language interpreter was used.  Ankle Injury  This is a new problem. The current episode started 1 to 2 hours ago. The problem occurs constantly. The problem has not changed since onset.The symptoms are aggravated by exertion, bending and twisting. Nothing relieves the symptoms.   HPI Comments:  Paula [name] is a 35 y.o. female who presents to the Emergency Department s/p a fall in which she sustained a R ankle injury. Pt reports that she was playing kickball, fell, and heard a crack or "pop" in R ankle. Pt notes that the pain radiates up her leg. Pt has not taken medication for pain. Pt notes a known allergy to hydrocodone.  Past Medical History:  Diagnosis Date  . Anxiety     There are no active problems to display for this patient.   Past Surgical History:  Procedure Laterality Date  . CESAREAN SECTION      OB History    No data available       Home Medications    Prior to Admission medications   Medication Sig Start Date End Date Taking? Authorizing Provider  albuterol (PROVENTIL HFA;VENTOLIN HFA) 108 (90 BASE) MCG/ACT inhaler Inhale 2 puffs into the lungs every 6 (six) hours as needed for wheezing or shortness of breath. 09/24/14   Thao P Le, DO  amoxicillin (AMOXIL) 500 MG capsule Take 2 capsules (1,000 mg total) by mouth 2 (two) times daily. 12/01/15   Hayden Rasmussen, NP  azithromycin (ZITHROMAX) 250 MG tablet Take two tablet today. Then take one tablet daily for 4 days 12/12/15   Doreene Eland, MD  clonazePAM (KLONOPIN) 0.5 MG  tablet Take 0.5 mg by mouth 3 (three) times daily as needed.    Historical Provider, MD  escitalopram (LEXAPRO) 10 MG tablet Take 10 mg by mouth daily.    Historical Provider, MD  HYDROcodone-homatropine (HYCODAN) 5-1.5 MG/5ML syrup Take 5 mLs by mouth at bedtime as needed. 09/24/14   Thao P Le, DO  ibuprofen (ADVIL,MOTRIN) 400 MG tablet Take 1 tablet (400 mg total) by mouth every 6 (six) hours as needed. 12/12/15   Doreene Eland, MD  naproxen (NAPROSYN) 375 MG tablet Take 1 tablet (375 mg total) by mouth 2 (two) times daily. 03/07/16   Hope Orlene Och, NP  oseltamivir (TAMIFLU) 75 MG capsule Take 1 capsule (75 mg total) by mouth 2 (two) times daily. 09/24/14   Thao P Le, DO  traMADol (ULTRAM) 50 MG tablet Take 1 tablet (50 mg total) by mouth every 6 (six) hours as needed. 03/07/16   Hope Orlene Och, NP    Family History No family history on file.  Social History Social History  Substance Use Topics  . Smoking status: Current Every Day Smoker    Packs/day: 1.00    Types: Cigarettes  . Smokeless tobacco: Never Used  . Alcohol use Yes     Comment: occasion     Allergies   Vicodin [hydrocodone-acetaminophen]   Review of Systems Review of Systems  Constitutional: Negative for fever.  Musculoskeletal:  Positive for arthralgias, gait problem and joint swelling.  All other systems reviewed and are negative.    Physical Exam Updated Vital Signs BP 117/69   Pulse 102   Temp 98.6 F (37 C) (Oral)   Resp 20   Ht 5\' 5"  (1.651 m)   Wt 74.8 kg   LMP 02/29/2016   SpO2 97%   BMI 27.46 kg/m   Physical Exam  Constitutional: She appears well-developed and well-nourished. No distress.  HENT:  Head: Normocephalic and atraumatic.  Eyes: Conjunctivae are normal.  Neck: Neck supple.  Cardiovascular: Normal rate, regular rhythm and intact distal pulses.   No murmur heard. Normal circulaton  Pulmonary/Chest: Effort normal and breath sounds normal. No respiratory distress.  Abdominal: Soft.  There is no tenderness.  Musculoskeletal:       Right ankle: She exhibits swelling. She exhibits no deformity and normal pulse. Decreased range of motion: due to pain. Tenderness. Lateral malleolus tenderness found. Achilles tendon normal.       Feet:  Swelling to lateral malleolus, tender to palpation  Neurological: She is alert.  Skin: Skin is warm and dry.  Psychiatric: She has a normal mood and affect. Her behavior is normal.  Nursing note and vitals reviewed.    ED Treatments / Results  DIAGNOSTIC STUDIES:  Oxygen Saturation is 97% on RA, normal by my interpretation.    COORDINATION OF CARE:  9:56 PM Discussed treatment plan with pt at bedside and pt agreed to plan.  Labs (all labs ordered are listed, but only abnormal results are displayed) Labs Reviewed - No data to display  Radiology Dg Ankle Complete Right  Result Date: 03/07/2016 CLINICAL DATA:  Right ankle injury tonight while playing kickball. Heard the ankle crack. Right lateral ankle pain and swelling. EXAM: RIGHT ANKLE - COMPLETE 3+ VIEW COMPARISON:  None. FINDINGS: Tiny old ununited ossicle inferior to the lateral malleolus. No evidence of acute fracture or dislocation. No focal bone lesion or bone destruction. Ankle mortise and talar dome appear intact. Soft tissues are unremarkable. IMPRESSION: No acute bony abnormalities. Electronically Signed   By: Burman NievesWilliam  Stevens M.D.   On: 03/07/2016 21:51    Procedures Procedures (including critical care time)  Medications Ordered in ED Medications  oxyCODONE-acetaminophen (PERCOCET/ROXICET) 5-325 MG per tablet 1 tablet (1 tablet Oral Given 03/07/16 2205)  ondansetron (ZOFRAN-ODT) disintegrating tablet 4 mg (4 mg Oral Given 03/07/16 2205)     Initial Impression / Assessment and Plan / ED Course  I have reviewed the triage vital signs and the nursing notes.  Pertinent labs & imaging results that were available during my care of the patient were reviewed by me and  considered in my medical decision making (see chart for details).  Clinical Course    Final Clinical Impressions(s) / ED Diagnoses  35 y.o. female with right ankle pain and swelling s/p injury earlier tonight stable for d/c without focal neuro deficits. Splint, crutches, pain management. Patient to f/u with ortho. Discussed with the patient clinical and x-ray findings and all questioned fully answered. She will return if any problems arise.  Final diagnoses:  Right ankle pain    New Prescriptions New Prescriptions   NAPROXEN (NAPROSYN) 375 MG TABLET    Take 1 tablet (375 mg total) by mouth 2 (two) times daily.   TRAMADOL (ULTRAM) 50 MG TABLET    Take 1 tablet (50 mg total) by mouth every 6 (six) hours as needed.  I personally performed the services described in this documentation,  which was scribed in my presence. The recorded information has been reviewed and is accurate.     [address] [place], [place] 03/07/16 2234    Raeford Razor, MD 03/18/16 [phone]

## 2016-03-07 NOTE — Progress Notes (Signed)
Orthopedic Tech Progress Note Patient Details:  Paula [name] July 20, 1980 [id number]  Ortho Devices Type of Ortho Device: Crutches, CAM walker Ortho Device/Splint Interventions: Application   Saul FordyceJennifer C Shalisa Mcquade 03/07/2016, 10:25 PM

## 2016-03-07 NOTE — ED Notes (Signed)
Patient transported to X-ray 

## 2016-03-07 NOTE — ED Notes (Signed)
Pt requesting pain medication.  

## 2016-03-07 NOTE — Discharge Instructions (Signed)
Elevate the area, apply ice, take the medication as directed. Do not take the narcotic if driving as it will make you sleepy.

## 2017-01-12 ENCOUNTER — Ambulatory Visit (HOSPITAL_COMMUNITY)
Admission: EM | Admit: 2017-01-12 | Discharge: 2017-01-12 | Disposition: A | Discharge status: Home / Self Care | Payer: Medicaid Other | Attending: Internal Medicine | Admitting: Internal Medicine

## 2017-01-12 ENCOUNTER — Encounter (HOSPITAL_COMMUNITY): Payer: Self-pay

## 2017-01-12 DIAGNOSIS — H66001 Acute suppurative otitis media without spontaneous rupture of ear drum, right ear: Secondary | ICD-10-CM | POA: Diagnosis not present

## 2017-01-12 MED ORDER — FLUCONAZOLE 200 MG PO TABS: ORAL_TABLET | ORAL | 0 refills | Status: DC

## 2017-01-12 MED ORDER — AMOXICILLIN-POT CLAVULANATE 875-125 MG PO TABS: 2.0000 | ORAL_TABLET | Freq: Two times a day (BID) | ORAL | 0 refills | Status: DC

## 2017-01-12 MED ORDER — MONTELUKAST SODIUM 10 MG PO TABS: 10.0000 mg | ORAL_TABLET | Freq: Every day | ORAL | 2 refills | Status: AC

## 2017-01-12 NOTE — ED Triage Notes (Signed)
Right ear pain for 4-5 days. Can hear popping and a "whoosing" sound like moisture is there. Also messing with her balance. No fever. Did try tylenol & sudafed without relief.

## 2017-01-12 NOTE — ED Provider Notes (Signed)
CSN: [id number]     Arrival date & time 01/12/17  1352 History   First MD Initiated Contact with Patient 01/12/17 1502     Chief Complaint  Patient presents with  . Otalgia   (Consider location/radiation/quality/duration/timing/severity/associated sxs/prior Treatment) The history is provided by the patient.  Otalgia  Location:  Right Behind ear:  No abnormality Quality:  Aching, pressure, sharp and throbbing Severity:  Severe Onset quality:  Gradual Duration:  5 days Timing:  Constant Progression:  Worsening Chronicity:  New Context: recent URI   Context: not direct blow, not elevation change, not foreign body in ear, not loud noise and not water in ear   Relieved by:  Nothing Worsened by:  Nothing Ineffective treatments:  OTC medications Associated symptoms: congestion and rhinorrhea   Associated symptoms: no cough, no ear discharge, no fever, no hearing loss, no tinnitus and no vomiting     Past Medical History:  Diagnosis Date  . Anxiety    Past Surgical History:  Procedure Laterality Date  . CESAREAN SECTION     No family history on file. Social History  Substance Use Topics  . Smoking status: Current Every Day Smoker    Packs/day: 1.00    Types: Cigarettes  . Smokeless tobacco: Never Used  . Alcohol use Yes     Comment: occasion   OB History    No data available     Review of Systems  Constitutional: Negative for chills and fever.  HENT: Positive for congestion, ear pain and rhinorrhea. Negative for ear discharge, hearing loss and tinnitus.   Respiratory: Negative for cough and wheezing.   Cardiovascular: Negative.   Gastrointestinal: Negative for nausea and vomiting.  Musculoskeletal: Negative.   Skin: Negative.   Neurological: Negative.     Allergies  Vicodin [hydrocodone-acetaminophen]  Home Medications   Prior to Admission medications   Medication Sig Start Date End Date Taking? Authorizing Provider  clonazePAM (KLONOPIN) 0.5 MG tablet Take  0.5 mg by mouth 3 (three) times daily as needed.   Yes [provider]  albuterol (PROVENTIL HFA;VENTOLIN HFA) 108 (90 BASE) MCG/ACT inhaler Inhale 2 puffs into the lungs every 6 (six) hours as needed for wheezing or shortness of breath. 09/24/14   Le, Thao P, DO  amoxicillin-clavulanate (AUGMENTIN) 875-125 MG tablet Take 2 tablets by mouth 2 (two) times daily. 01/12/17   Dorena Bodo, NP  escitalopram (LEXAPRO) 10 MG tablet Take 10 mg by mouth daily.    [provider]  fluconazole (DIFLUCAN) 200 MG tablet Take one tablet today, wait 3 days, take the second tablet 01/12/17   Dorena Bodo, NP  ibuprofen (ADVIL,MOTRIN) 400 MG tablet Take 1 tablet (400 mg total) by mouth every 6 (six) hours as needed. 12/12/15   Doreene Eland, MD  montelukast (SINGULAIR) 10 MG tablet Take 1 tablet (10 mg total) by mouth at bedtime. 01/12/17   Dorena Bodo, NP  naproxen (NAPROSYN) 375 MG tablet Take 1 tablet (375 mg total) by mouth 2 (two) times daily. 03/07/16   Janne Napoleon, NP  traMADol (ULTRAM) 50 MG tablet Take 1 tablet (50 mg total) by mouth every 6 (six) hours as needed. 03/07/16   Janne Napoleon, NP   Meds Ordered and Administered this Visit  Medications - No data to display  BP 114/70 (BP Location: Left Arm)   Pulse 97   Temp 98.3 F (36.8 C) (Oral)   Resp 20   LMP 12/29/2016 (Exact Date)   SpO2 100%  No data found.   Physical Exam  Constitutional: She is oriented to person, place, and time. She appears well-developed and well-nourished. No distress.  HENT:  Head: Normocephalic and atraumatic.  Right Ear: External ear normal.  Left Ear: External ear normal.  Mouth/Throat: Oropharynx is clear and moist.  Right tympanic membrane erythematous, and bulging consistent with otitis media  Neck: Normal range of motion.  Cardiovascular: Normal rate and regular rhythm.   Pulmonary/Chest: Effort normal and breath sounds normal.  Lymphadenopathy:    She has no cervical  adenopathy.  Neurological: She is alert and oriented to person, place, and time.  Skin: Skin is warm and dry. Capillary refill takes less than 2 seconds. No rash noted. She is not diaphoretic. No erythema.  Psychiatric: She has a normal mood and affect. Her behavior is normal.  Nursing note and vitals reviewed.   Urgent Care Course     Procedures (including critical care time)  Labs Review Labs Reviewed - No data to display  Imaging Review No results found.     MDM   1. Acute suppurative otitis media of right ear without spontaneous rupture of tympanic membrane, recurrence not specified     Treating for otitis media, starting on Augmentin, also given prophylactic Diflucan due to frequent yeast infections on antibiotics. Continue daily antihistamine, also start on Singulair for seasonal allergies. Follow-up with her primary care provider or return to clinic as needed.    Dorena BodoKennard, Ayo Smoak, NP 01/12/17 1553

## 2017-01-12 NOTE — Discharge Instructions (Signed)
You have otitis media, prescribed Augmentin, take 2 tablets twice a day for 7 days. I have included a Diflucan in case she had a yeast infection, wait until you finished your antibiotics, and then only if symptomatic. Continue to take an OTC antihistamine of your choice (Claritin, Allegra, Zyrtec, or Xyzal) I have also added Singulair, take one tablet every night at bed time. If symptoms worsen, return to clinic as needed.

## 2017-03-02 IMAGING — DX DG ANKLE COMPLETE 3+V*R*
3 series · 3 of 3 positions shown · non-contrast
Comparison: None.

CLINICAL DATA: Right ankle injury tonight while playing kickball.
Heard the ankle crack. Right lateral ankle pain and swelling.

EXAM:
RIGHT ANKLE - COMPLETE 3+ VIEW

[x ankle ap right]
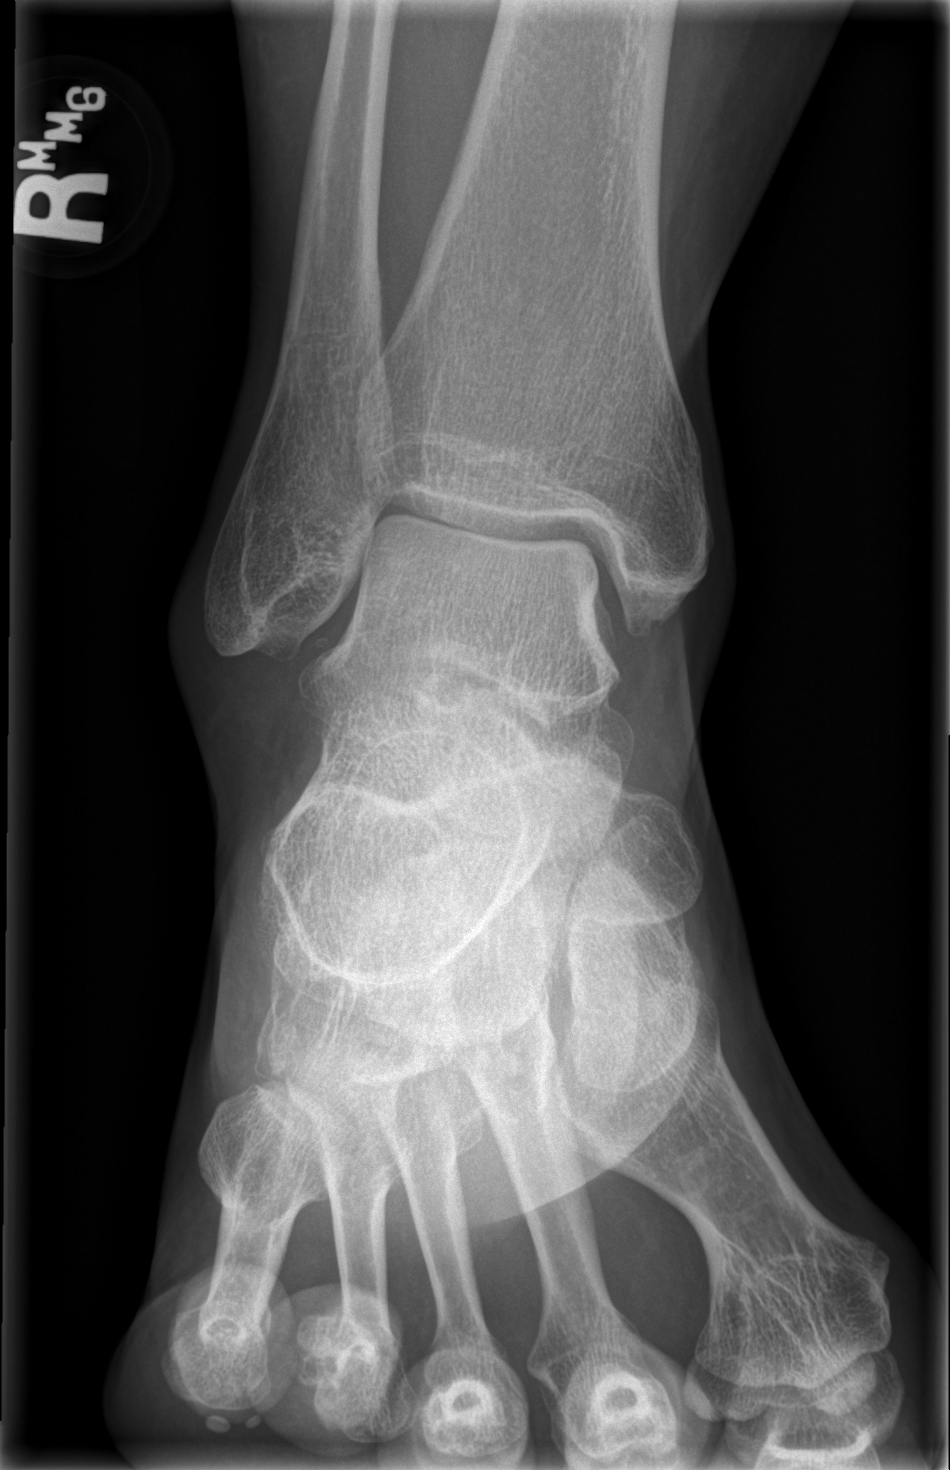

[x ankle obl right]
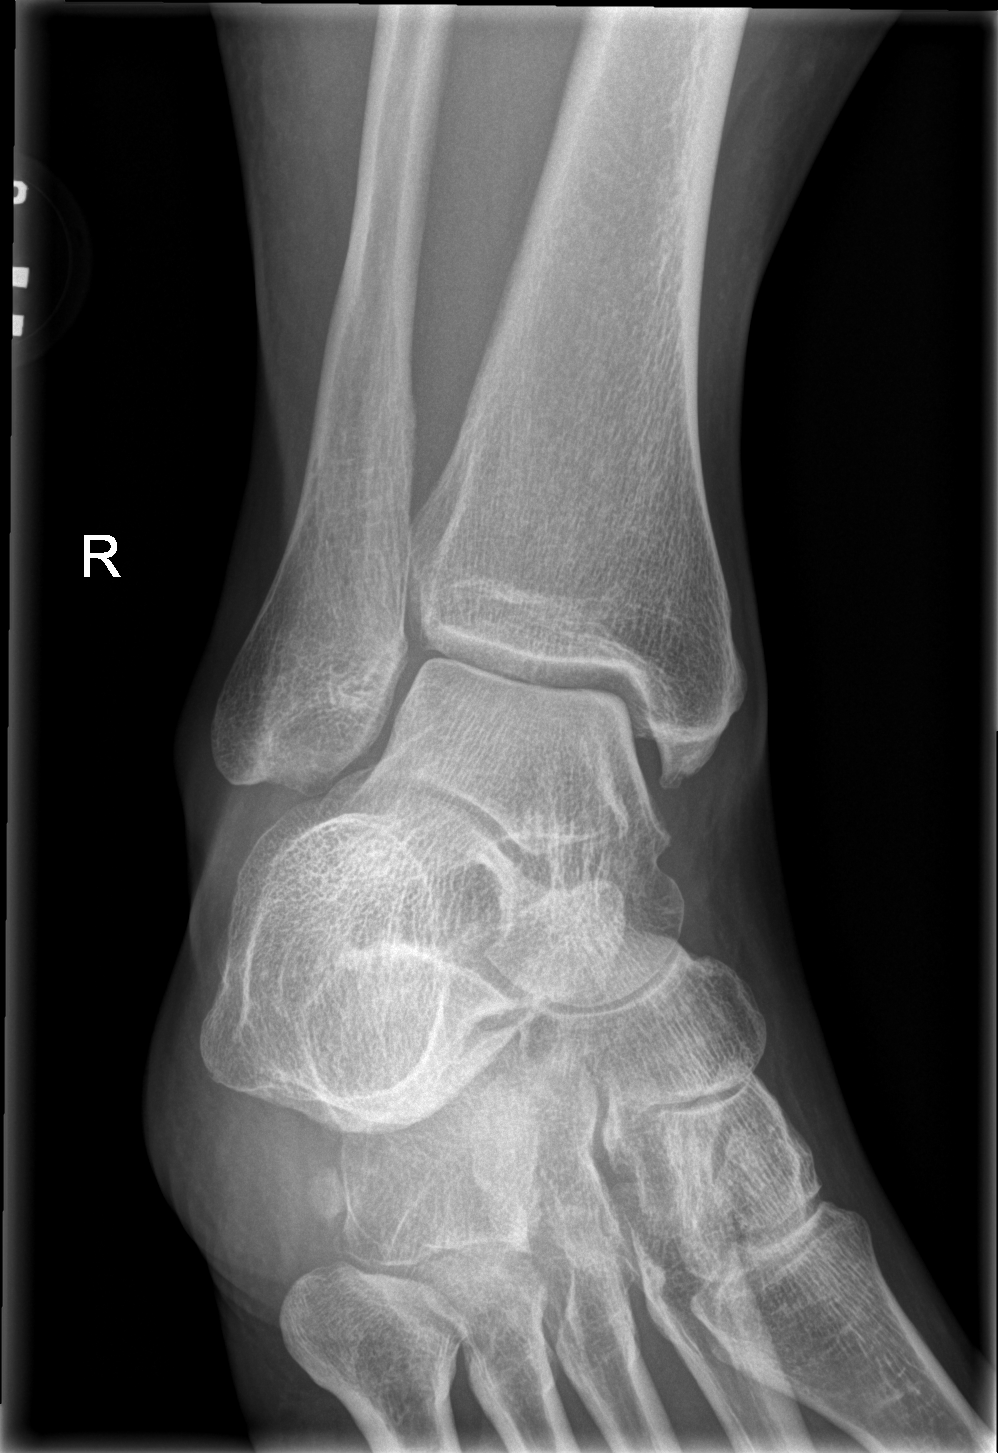

[x ankle lat right]
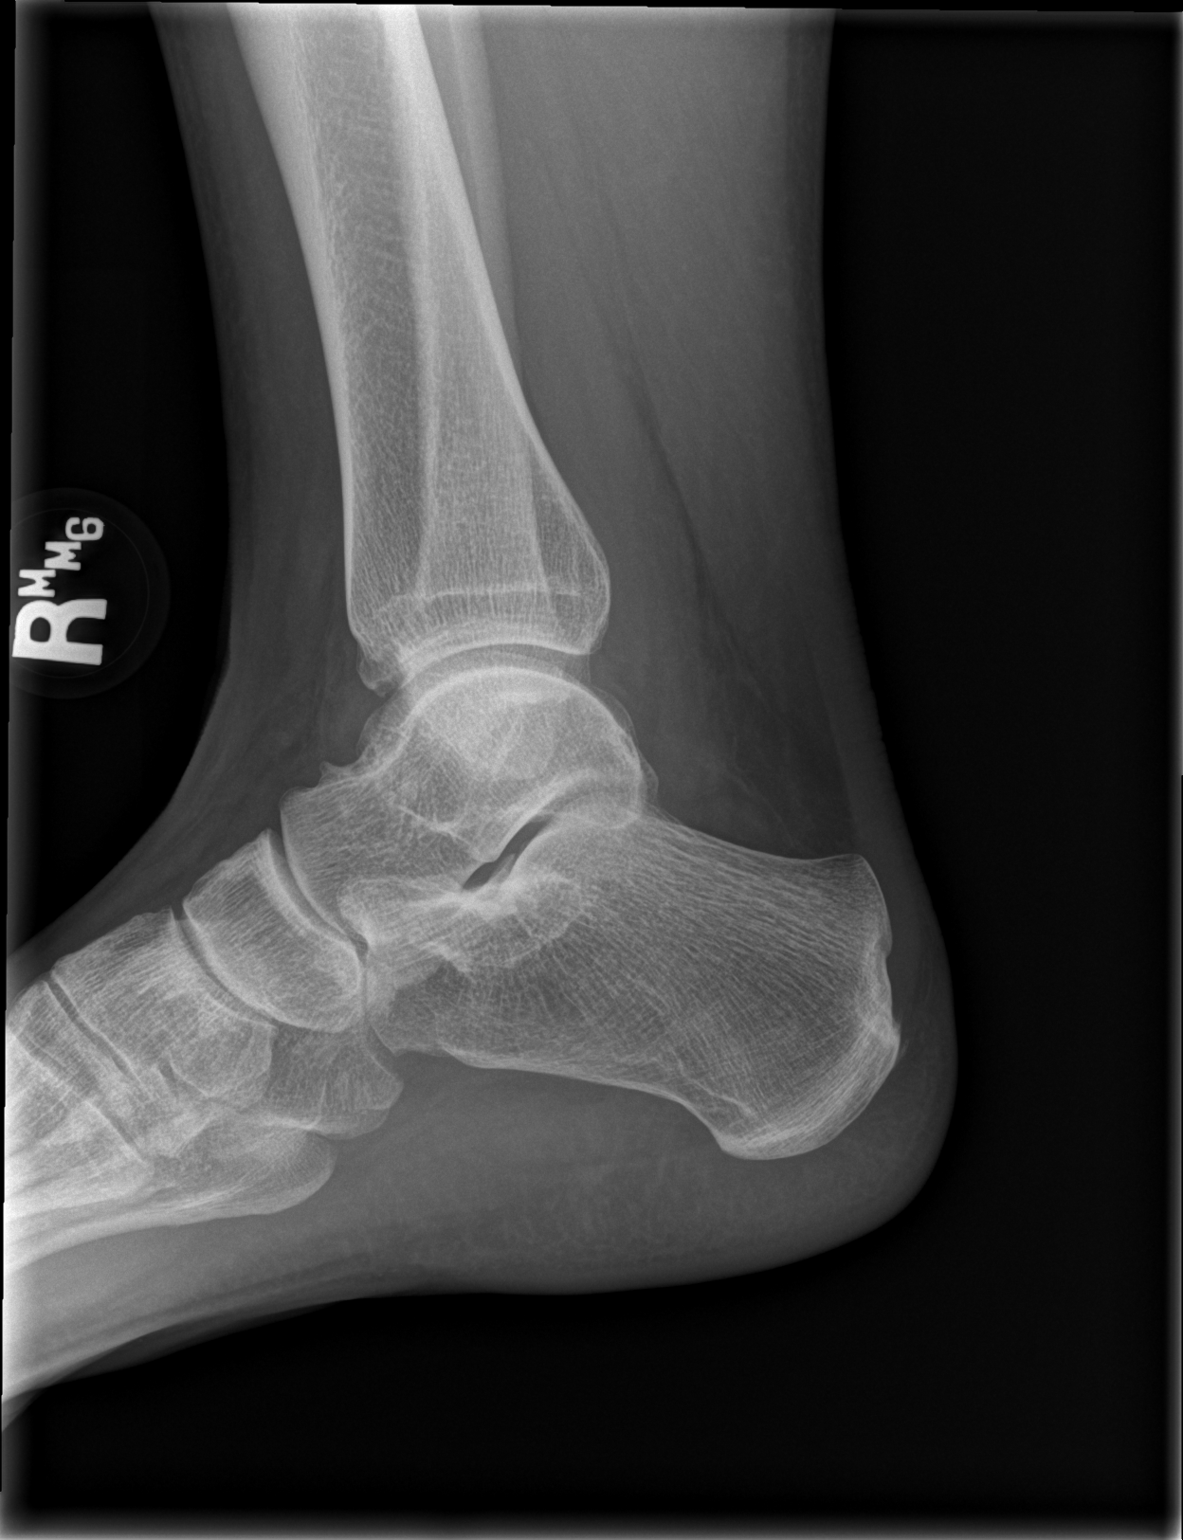

[3 of 3 positions shown; findings below may reference images not displayed]

FINDINGS: Tiny old ununited ossicle inferior to the lateral malleolus. No
evidence of acute fracture or dislocation. No focal bone lesion or
bone destruction. Ankle mortise and talar dome appear intact. Soft
tissues are unremarkable.
IMPRESSION: No acute bony abnormalities.

## 2017-03-06 ENCOUNTER — Encounter (HOSPITAL_COMMUNITY): Payer: Self-pay | Admitting: Emergency Medicine

## 2017-03-06 ENCOUNTER — Ambulatory Visit (HOSPITAL_COMMUNITY)
Admission: EM | Admit: 2017-03-06 | Discharge: 2017-03-06 | Disposition: A | Discharge status: Home / Self Care | Payer: Medicaid Other | Attending: Emergency Medicine | Admitting: Emergency Medicine

## 2017-03-06 DIAGNOSIS — R21 Rash and other nonspecific skin eruption: Secondary | ICD-10-CM | POA: Diagnosis not present

## 2017-03-06 DIAGNOSIS — W57XXXA Bitten or stung by nonvenomous insect and other nonvenomous arthropods, initial encounter: Secondary | ICD-10-CM

## 2017-03-06 DIAGNOSIS — B850 Pediculosis due to Pediculus humanus capitis: Secondary | ICD-10-CM

## 2017-03-06 MED ORDER — PERMETHRIN 5 % EX CREA: TOPICAL_CREAM | CUTANEOUS | 1 refills | Status: DC

## 2017-03-06 MED ORDER — SULFAMETHOXAZOLE-TRIMETHOPRIM 800-160 MG PO TABS: 1.0000 | ORAL_TABLET | Freq: Two times a day (BID) | ORAL | 0 refills | Status: AC

## 2017-03-06 MED ORDER — IVERMECTIN 3 MG PO TABS: 12.0000 mg | ORAL_TABLET | Freq: Once | ORAL | 0 refills | Status: AC

## 2017-03-06 NOTE — ED Triage Notes (Signed)
Pt here for head lice and rash all over body onset 3-4 days  Believes the rash may be chiggers   A&O x4... NAD... Ambulatory

## 2017-03-06 NOTE — ED Provider Notes (Signed)
[facility]    CSN: [id number] Arrival date & time: 03/06/17  2000     History   Chief Complaint Chief Complaint  Patient presents with  . Head Lice  . Rash    HPI Paula [name] is a 36 y.o. female.   36 year old female states she has been exposed to lice and possibly scabies, fleas or other insects recently. She has scalp itching and has been seeing black spots in her hair and eggs along the shaft of the hair. She also has several insect bites to the arms and legs and lesser to the torso. She recently found fleas on her pet. She has bombed a house with insecticide. She has itching everywhere. She [place] and hugs a lot of people, has a Nurse, mental health with fleas, has been working in time needles another dirt and plants.      Past Medical History:  Diagnosis Date  . Anxiety     There are no active problems to display for this patient.   Past Surgical History:  Procedure Laterality Date  . CESAREAN SECTION      OB History    No data available       Home Medications    Prior to Admission medications   Medication Sig Start Date End Date Taking? Authorizing Provider  clonazePAM (KLONOPIN) 0.5 MG tablet Take 0.5 mg by mouth 3 (three) times daily as needed.   Yes [provider]  escitalopram (LEXAPRO) 10 MG tablet Take 10 mg by mouth daily.   Yes [provider]  montelukast (SINGULAIR) 10 MG tablet Take 1 tablet (10 mg total) by mouth at bedtime. 01/12/17  Yes Dorena Bodo, NP  albuterol (PROVENTIL HFA;VENTOLIN HFA) 108 (90 BASE) MCG/ACT inhaler Inhale 2 puffs into the lungs every 6 (six) hours as needed for wheezing or shortness of breath. 09/24/14   Le, Thao P, DO  amoxicillin-clavulanate (AUGMENTIN) 875-125 MG tablet Take 2 tablets by mouth 2 (two) times daily. 01/12/17   Dorena Bodo, NP  fluconazole (DIFLUCAN) 200 MG tablet Take one tablet today, wait 3 days, take the second tablet 01/12/17   Dorena Bodo, NP    ibuprofen (ADVIL,MOTRIN) 400 MG tablet Take 1 tablet (400 mg total) by mouth every 6 (six) hours as needed. 12/12/15   Doreene Eland, MD  ivermectin (STROMECTOL) 3 MG TABS tablet Take 4 tablets (12 mg total) by mouth once. 03/06/17 03/06/17  Hayden Rasmussen, NP  naproxen (NAPROSYN) 375 MG tablet Take 1 tablet (375 mg total) by mouth 2 (two) times daily. 03/07/16   Janne Napoleon, NP  permethrin (ELIMITE) 5 % cream Apply from chin down, leave on for 8-14 hours, rinse. Repeat in 1 week 03/06/17   Hayden Rasmussen, NP  sulfamethoxazole-trimethoprim (BACTRIM DS,SEPTRA DS) 800-160 MG tablet Take 1 tablet by mouth 2 (two) times daily. 03/06/17 03/13/17  Hayden Rasmussen, NP  traMADol (ULTRAM) 50 MG tablet Take 1 tablet (50 mg total) by mouth every 6 (six) hours as needed. 03/07/16   Janne Napoleon, NP    Family History History reviewed. No pertinent family history.  Social History Social History  Substance Use Topics  . Smoking status: Current Every Day Smoker    Packs/day: 1.00    Types: Cigarettes  . Smokeless tobacco: Never Used  . Alcohol use Yes     Comment: occasion     Allergies   Vicodin [hydrocodone-acetaminophen]   Review of Systems Review of Systems  Constitutional: Negative.  Skin: Positive for rash.  All other systems reviewed and are negative.    Physical Exam Triage Vital Signs ED Triage Vitals [03/06/17 2048]  Enc Vitals Group     BP 121/75     Pulse Rate 70     Resp (!) 22     Temp 98.1 F (36.7 C)     Temp Source Oral     SpO2 100 %     Weight      Height      Head Circumference      Peak Flow      Pain Score      Pain Loc      Pain Edu?      Excl. in GC?    No data found.   Updated Vital Signs BP 121/75 (BP Location: Left Arm)   Pulse 70   Temp 98.1 F (36.7 C) (Oral)   Resp (!) 22   LMP 02/17/2017   SpO2 100%   Visual Acuity Right Eye Distance:   Left Eye Distance:   Bilateral Distance:    Right Eye Near:   Left Eye Near:    Bilateral Near:      Physical Exam  Constitutional: She is oriented to person, place, and time. She appears well-developed and well-nourished. No distress.  Neck: Neck supple.  Pulmonary/Chest: Effort normal.  Neurological: She is alert and oriented to person, place, and time.  Skin: Skin is warm and dry.  Scalp exam reveals no actual areas of the scalp which appear to be an bitten. No vermin or insect seen in the hair. Few areas of the hair with small white egg attached to the shaft. There are several insect bites to the extremities. She also has eczema. These may be due to fleas or possibly scabies.  Psychiatric: She has a normal mood and affect.  Nursing note and vitals reviewed.    UC Treatments / Results  Labs (all labs ordered are listed, but only abnormal results are displayed) Labs Reviewed - No data to display  EKG  EKG Interpretation None       Radiology No results found.  Procedures Procedures (including critical care time)  Medications Ordered in UC Medications - No data to display   Initial Impression / Assessment and Plan / UC Course  I have reviewed the triage vital signs and the nursing notes.  Pertinent labs & imaging results that were available during my care of the patient were reviewed by me and considered in my medical decision making (see chart for details).     Use and take the medication as directed. Sure all your sheets and bedding have been cleaned. Follow-up with her primary care doctor as needed.   Final Clinical Impressions(s) / UC Diagnoses   Final diagnoses:  Head lice  Insect bite, initial encounter  Rash    New Prescriptions New Prescriptions   IVERMECTIN (STROMECTOL) 3 MG TABS TABLET    Take 4 tablets (12 mg total) by mouth once.   PERMETHRIN (ELIMITE) 5 % CREAM    Apply from chin down, leave on for 8-14 hours, rinse. Repeat in 1 week   SULFAMETHOXAZOLE-TRIMETHOPRIM (BACTRIM DS,SEPTRA DS) 800-160 MG TABLET    Take 1 tablet by mouth 2 (two)  times daily.     Controlled Substance Prescriptions [place] Controlled Substance Registry consulted? Not Applicable   Hayden Rasmussen, NP 03/06/17 2123

## 2017-03-06 NOTE — Discharge Instructions (Signed)
Use and take the medication as directed. Sure all your sheets and bedding have been cleaned. Follow-up with her primary care doctor as needed.

## 2017-04-03 ENCOUNTER — Ambulatory Visit (HOSPITAL_COMMUNITY)
Admission: EM | Admit: 2017-04-03 | Discharge: 2017-04-03 | Disposition: A | Discharge status: Home / Self Care | Payer: Medicaid Other | Attending: Family Medicine | Admitting: Family Medicine

## 2017-04-03 ENCOUNTER — Encounter (HOSPITAL_COMMUNITY): Payer: Self-pay | Admitting: Emergency Medicine

## 2017-04-03 DIAGNOSIS — J0100 Acute maxillary sinusitis, unspecified: Secondary | ICD-10-CM

## 2017-04-03 MED ORDER — AZITHROMYCIN 250 MG PO TABS: 250.0000 mg | ORAL_TABLET | Freq: Every day | ORAL | 0 refills | Status: DC

## 2017-04-03 MED ORDER — FLUCONAZOLE 150 MG PO TABS: 150.0000 mg | ORAL_TABLET | Freq: Once | ORAL | 0 refills | Status: AC

## 2017-04-03 MED ORDER — AMOXICILLIN-POT CLAVULANATE 875-125 MG PO TABS: 2.0000 | ORAL_TABLET | Freq: Two times a day (BID) | ORAL | 0 refills | Status: DC

## 2017-04-03 NOTE — ED Notes (Signed)
Patient discharged by provider.

## 2017-04-03 NOTE — Discharge Instructions (Signed)
You will get relief with oxymetazoline (Afrin) nasal spray to right.

## 2017-04-03 NOTE — ED Triage Notes (Signed)
Pt. Stated, I have a sinus infection that started 3 days ago.

## 2017-04-03 NOTE — ED Provider Notes (Signed)
[facility]   [id number] 04/03/17 Arrival Time: 1921   SUBJECTIVE:  Paula [name] is a 36 y.o. female who presents to the urgent care with complaint of sinus infection that started 3 days ago.  Facial pain and headache along with right sided ear ache are combining with severe right sided congestion to make life miserable  Patient is a one pack-a-day smoker, works three jobs   Past Medical History:  Diagnosis Date  . Anxiety    No family history on file. Social History   Social History  . Marital status: Divorced    Spouse name: N/A  . Number of children: N/A  . Years of education: N/A   Occupational History  . Not on file.   Social History Main Topics  . Smoking status: Current Every Day Smoker    Packs/day: 1.00    Types: Cigarettes  . Smokeless tobacco: Never Used  . Alcohol use Yes     Comment: occasion  . Drug use: No  . Sexual activity: Not on file   Other Topics Concern  . Not on file   Social History Narrative  . No narrative on file   No outpatient prescriptions have been marked as taking for the 04/03/17 encounter [facility] Encounter).   Allergies  Allergen Reactions  . Vicodin [Hydrocodone-Acetaminophen] Hives      ROS: As per HPI, remainder of ROS negative.   OBJECTIVE:   Vitals:   04/03/17 1941  BP: 120/67  Pulse: 94  Resp: 17  Temp: 98.5 F (36.9 C)  TempSrc: Oral  SpO2: 100%  Weight: 155 lb (70.3 kg)  Height:  (1.651 m)     General appearance: alert; no distress Eyes: PERRL; EOMI; conjunctiva normal HENT: normocephalic; atraumatic; TMs normal, canal normal, external ears normal without trauma; nasal mucosa Thick and purulent debris bilaterally, worse on the right; oral mucosa normal; tender right maxilla Neck: supple Lungs: clear to auscultation bilaterally Heart: regular rate and rhythm Back: no CVA tenderness Extremities: no cyanosis or edema; symmetrical with no gross deformities Skin: warm and  dry Neurologic: normal gait; grossly normal Psychological: alert and cooperative; normal mood and affect      Labs:  Results for orders placed or performed during the hospital encounter of 12/12/15  Culture, group A strep  Result Value Ref Range   Specimen Description THROAT    Special Requests NONE    Culture FEW STREPTOCOCCUS,BETA HEMOLYTIC NOT GROUP A    Report Status 12/16/2015 FINAL   POCT rapid strep A [facility])  Result Value Ref Range   Streptococcus, Group A Screen (Direct) NEGATIVE NEGATIVE    Labs Reviewed - No data to display  No results found.     ASSESSMENT & PLAN:  1. Acute maxillary sinusitis, recurrence not specified     Meds ordered this encounter  Medications  . amoxicillin-clavulanate (AUGMENTIN) 875-125 MG tablet    Sig: Take 2 tablets by mouth 2 (two) times daily.    Dispense:  28 tablet    Refill:  0  . fluconazole (DIFLUCAN) 150 MG tablet    Sig: Take 1 tablet (150 mg total) by mouth once. Repeat if needed    Dispense:  2 tablet    Refill:  0    Reviewed expectations re: course of current medical issues. Questions answered. Outlined signs and symptoms indicating need for more acute intervention. Patient verbalized understanding. After Visit Summary given.    Procedures:      Elvina Sidle,  MD 04/03/17 1958

## 2017-05-15 ENCOUNTER — Encounter (HOSPITAL_COMMUNITY): Payer: Self-pay | Admitting: Emergency Medicine

## 2017-05-15 ENCOUNTER — Ambulatory Visit (HOSPITAL_COMMUNITY)
Admission: EM | Admit: 2017-05-15 | Discharge: 2017-05-15 | Disposition: A | Discharge status: Home / Self Care | Payer: Medicaid Other | Attending: Emergency Medicine | Admitting: Emergency Medicine

## 2017-05-15 DIAGNOSIS — L299 Pruritus, unspecified: Secondary | ICD-10-CM

## 2017-05-15 DIAGNOSIS — J320 Chronic maxillary sinusitis: Secondary | ICD-10-CM

## 2017-05-15 MED ORDER — AMOXICILLIN-POT CLAVULANATE 875-125 MG PO TABS: 1.0000 | ORAL_TABLET | Freq: Two times a day (BID) | ORAL | 0 refills | Status: AC

## 2017-05-15 MED ORDER — HYDROXYZINE HCL 50 MG PO TABS: 50.0000 mg | ORAL_TABLET | Freq: Three times a day (TID) | ORAL | 0 refills | Status: AC | PRN

## 2017-05-15 MED ORDER — FLUTICASONE PROPIONATE 50 MCG/ACT NA SUSP: 2.0000 | Freq: Every day | NASAL | 0 refills | Status: AC

## 2017-05-15 MED ORDER — PERMETHRIN 5 % EX CREA: TOPICAL_CREAM | CUTANEOUS | 1 refills | Status: AC

## 2017-05-15 NOTE — Discharge Instructions (Signed)
Use the Flonase, use the saline nasal irrigation with a Neti pot or Lloyd Hugereil med sinus rinse as often as you want.  Do not put foreign bodies such as your fingers or Q-tips up in your nose.  This will not help, this will make things worse.  Try the Atarax.  It will help with the itching may also help you get some rest.  Follow-up with your primary care physician within the week.

## 2017-05-15 NOTE — ED Provider Notes (Signed)
HPI  SUBJECTIVE:  Paula [name] is a 36 y.o. female who presents with a "chigger infestation" for the past 3 months.  She states that they are all over her entire body, burrowing in her nose, coming out of her lips, her vagina, and are in her eye sockets.  She feels like she is being bitten constantly.  She has tried pyrethrin, tea tree oil, DEET, essential oils and hot showers.  No alleviating factors.  She states that the triggers come out when it is cold, at night and.  Reports purulent and bloody nasal congestion  and maxillary sinus tenderness.  States that her upper teeth hurt.  She reports a rash on her hands and legs, but denies contacts with similar rash.  No contacts in her house have similar symptoms.  She has a past medical history of eczema and anxiety.  LMP: Last week.  Denies possibility of being pregnant.  PMD: Harlow MaresBeard, Eldon S, MD   Past Medical History:  Diagnosis Date  . Anxiety     Past Surgical History:  Procedure Laterality Date  . CESAREAN SECTION      No family history on file.  Social History   Tobacco Use  . Smoking status: Current Every Day Smoker    Packs/day: 1.00    Types: Cigarettes  . Smokeless tobacco: Never Used  Substance Use Topics  . Alcohol use: Yes    Comment: occasion  . Drug use: No    No current facility-administered medications for this encounter.   Current Outpatient Medications:  .  clonazePAM (KLONOPIN) 0.5 MG tablet, Take 0.5 mg by mouth 3 (three) times daily as needed., Disp: , Rfl:  .  escitalopram (LEXAPRO) 10 MG tablet, Take 10 mg by mouth daily., Disp: , Rfl:  .  montelukast (SINGULAIR) 10 MG tablet, Take 1 tablet (10 mg total) by mouth at bedtime., Disp: 30 tablet, Rfl: 2 .  albuterol (PROVENTIL HFA;VENTOLIN HFA) 108 (90 BASE) MCG/ACT inhaler, Inhale 2 puffs into the lungs every 6 (six) hours as needed for wheezing or shortness of breath., Disp: 1 Inhaler, Rfl: 0 .  amoxicillin-clavulanate (AUGMENTIN) 875-125 MG tablet, Take  1 tablet 2 (two) times daily by mouth. X 7 days, Disp: 14 tablet, Rfl: 0 .  fluticasone (FLONASE) 50 MCG/ACT nasal spray, Place 2 sprays daily into both nostrils., Disp: 16 g, Rfl: 0 .  hydrOXYzine (ATARAX/VISTARIL) 50 MG tablet, Take 1 tablet (50 mg total) 3 (three) times daily as needed by mouth., Disp: 30 tablet, Rfl: 0 .  permethrin (ELIMITE) 5 % cream, Apply from chin down, leave on for 8-14 hours, rinse. Repeat in 1 week, Disp: 60 g, Rfl: 1  Allergies  Allergen Reactions  . Vicodin [Hydrocodone-Acetaminophen] Hives     ROS  As noted in HPI.   Physical Exam  BP 134/81 (BP Location: Left Arm)   Pulse 84   Temp 97.7 F (36.5 C) (Oral)   Resp 18   LMP 05/08/2017   SpO2 98%   Constitutional: Well developed, well nourished, no acute distress Eyes:  EOMI, conjunctiva normal bilaterally HENT: Normocephalic, atraumatic,mucus membranes moist.  Positive bloody purulent nasal congestion with maxillary sinus tenderness.  Positive erythematous, macerated, irritated nasal mucosa. Respiratory: Normal inspiratory effort Cardiovascular: Normal rate GI: nondistended skin: Several spots on skin which appear to be excoriations versus insect bites.  See pictures.      Musculoskeletal: no deformities Neurologic: Alert & oriented x 3, no focal neuro deficits Psychiatric: Speech fluent, appears anxious, tearful.  No tangential or pressured speech.  No active homicidal or suicidal ideation.  ED Course   Medications - No data to display  No orders of the defined types were placed in this encounter.   No results found for this or any previous visit (from the past 24 hour(s)). No results found.  ED Clinical Impression  Maxillary sinusitis, unspecified chronicity  Itching   ED Assessment/Plan   I am unable to appreciate any insects coming from her eyes, lips, or crawling out of her skin.  She does show me some small red dots on a kleenex that I am unable to positively identify as  chiggers on  magnification, in addition to the bloody and purulent nasal drainage.  She does have a sinus infection.  Most likely from her putting Q-tips up in her nose to try and take the bugs out.  Advised saline nasal irrigation, stop inserting things into her nose, Flonase, Augmentin.  Will try some Atarax that she seems to be very anxious.  This will also help with the itching.  We will have her follow-up with her PMD ASAP.   Concern for delusional beliefs.  She does not appear to be homicidal or suicidal at this time.   [id number] 11/7- Called PMD Dr. Annia FriendlyBeard and discussed with him.  They will reach out to her and bring her in for evaluation.  Discussed labs, imaging, MDM, plan and followup with patient.  patient agrees with plan.   Meds ordered this encounter  Medications  . permethrin (ELIMITE) 5 % cream    Sig: Apply from chin down, leave on for 8-14 hours, rinse. Repeat in 1 week    Dispense:  60 g    Refill:  1  . amoxicillin-clavulanate (AUGMENTIN) 875-125 MG tablet    Sig: Take 1 tablet 2 (two) times daily by mouth. X 7 days    Dispense:  14 tablet    Refill:  0  . fluticasone (FLONASE) 50 MCG/ACT nasal spray    Sig: Place 2 sprays daily into both nostrils.    Dispense:  16 g    Refill:  0  . hydrOXYzine (ATARAX/VISTARIL) 50 MG tablet    Sig: Take 1 tablet (50 mg total) 3 (three) times daily as needed by mouth.    Dispense:  30 tablet    Refill:  0    *This clinic note was created using Scientist, clinical (histocompatibility and immunogenetics)Dragon dictation software. Therefore, there may be occasional mistakes despite careful proofreading.   ?   Domenick GongMortenson, Martyna Thorns, MD 05/16/17 [phone]

## 2017-05-15 NOTE — ED Triage Notes (Addendum)
Patient reports chiggers for 2-3 months. States they are in her eyes and nose and skin is crawling .

## 2017-05-23 ENCOUNTER — Encounter (HOSPITAL_COMMUNITY): Payer: Self-pay | Admitting: *Deleted

## 2017-05-23 ENCOUNTER — Emergency Department (HOSPITAL_COMMUNITY)
Admission: EM | Admit: 2017-05-23 | Discharge: 2017-05-23 | Disposition: A | Discharge status: Home / Self Care | Payer: Medicaid Other | Attending: Emergency Medicine | Admitting: Emergency Medicine

## 2017-05-23 ENCOUNTER — Other Ambulatory Visit: Payer: Self-pay

## 2017-05-23 DIAGNOSIS — R21 Rash and other nonspecific skin eruption: Secondary | ICD-10-CM | POA: Diagnosis present

## 2017-05-23 DIAGNOSIS — F17210 Nicotine dependence, cigarettes, uncomplicated: Secondary | ICD-10-CM | POA: Insufficient documentation

## 2017-05-23 DIAGNOSIS — L259 Unspecified contact dermatitis, unspecified cause: Secondary | ICD-10-CM | POA: Insufficient documentation

## 2017-05-23 DIAGNOSIS — L309 Dermatitis, unspecified: Secondary | ICD-10-CM

## 2017-05-23 DIAGNOSIS — Z79899 Other long term (current) drug therapy: Secondary | ICD-10-CM | POA: Insufficient documentation

## 2017-05-23 LAB — COMPREHENSIVE METABOLIC PANEL
ALK PHOS: 59 U/L (ref 38–126)
ALT: 17 U/L (ref 14–54)
AST: 20 U/L (ref 15–41)
Albumin: 3.8 g/dL (ref 3.5–5.0)
Anion gap: 6 (ref 5–15)
BILIRUBIN TOTAL: 0.7 mg/dL (ref 0.3–1.2)
BUN: 5 mg/dL — ABNORMAL LOW (ref 6–20)
CALCIUM: 9.3 mg/dL (ref 8.9–10.3)
CO2: 29 mmol/L (ref 22–32)
CREATININE: 0.9 mg/dL (ref 0.44–1.00)
Chloride: 105 mmol/L (ref 101–111)
GFR calc non Af Amer: >60 mL/min (ref >60)
Glucose, Bld: 83 mg/dL (ref 65–99)
Potassium: 3.7 mmol/L (ref 3.5–5.1)
Sodium: 140 mmol/L (ref 135–145)
TOTAL PROTEIN: 6.8 g/dL (ref 6.5–8.1)

## 2017-05-23 LAB — CBC WITH DIFFERENTIAL/PLATELET
BASOS ABS: 0.1 10*3/uL (ref 0.0–0.1)
BASOS PCT: 1 %
EOS ABS: 0.5 10*3/uL (ref 0.0–0.7)
Eosinophils Relative: 5 %
HEMATOCRIT: 42.9 % (ref 36.0–46.0)
Hemoglobin: 14.1 g/dL (ref 12.0–15.0)
Lymphocytes Relative: 29 %
Lymphs Abs: 2.9 10*3/uL (ref 0.7–4.0)
MCH: 30 pg (ref 26.0–34.0)
MCHC: 32.9 g/dL (ref 30.0–36.0)
MCV: 91.3 fL (ref 78.0–100.0)
MONO ABS: 0.6 10*3/uL (ref 0.1–1.0)
Monocytes Relative: 6 %
NEUTROS ABS: 5.9 10*3/uL (ref 1.7–7.7)
NEUTROS PCT: 59 %
Platelets: 321 10*3/uL (ref 150–400)
RBC: 4.7 MIL/uL (ref 3.87–5.11)
RDW: 14.6 % (ref 11.5–15.5)
WBC: 9.9 10*3/uL (ref 4.0–10.5)

## 2017-05-23 LAB — I-STAT BETA HCG BLOOD, ED (MC, WL, AP ONLY)

## 2017-05-23 LAB — I-STAT CG4 LACTIC ACID, ED: Lactic Acid, Venous: 1.05 mmol/L (ref 0.5–1.9)

## 2017-05-23 NOTE — ED Triage Notes (Signed)
Pt states fluid "drips" out of her.  States "the fluid builds up and then just seeps out".  States "pressure increases" until she takes a hot bath and "all the fluid leaks out".  Has pics on phone.

## 2017-05-23 NOTE — Discharge Instructions (Signed)
Use topical hydrocortisone to hands.  Use a good moisturizer.  Avoid scratching.  See your Physician for recheck.

## 2017-05-23 NOTE — ED Provider Notes (Signed)
[facility]   CSN: [id number] Arrival date & time: 05/23/17  1025     History   Chief Complaint Chief Complaint  Patient presents with  . Fluid leaking    HPI Paula [name] is a 36 y.o. female.  The history is provided by the patient. No language interpreter was used.  Rash   This is a chronic problem. Episode onset: 4 months  The problem has been gradually worsening. The problem is associated with an insect bite/sting. There has been no fever. The patient is experiencing no pain. Associated symptoms include itching. She has tried nothing for the symptoms. The treatment provided no relief.  Pt reports she thinks her house is infested with chiggers.  Pt complains of a rash and itching full body.  Pt has seen her primary MD for the same  Past Medical History:  Diagnosis Date  . Anxiety     There are no active problems to display for this patient.   Past Surgical History:  Procedure Laterality Date  . CESAREAN SECTION      OB History    No data available       Home Medications    Prior to Admission medications   Medication Sig Start Date End Date Taking? Authorizing Provider  albuterol (PROVENTIL HFA;VENTOLIN HFA) 108 (90 BASE) MCG/ACT inhaler Inhale 2 puffs into the lungs every 6 (six) hours as needed for wheezing or shortness of breath. 09/24/14   Le, Thao P, DO  amoxicillin-clavulanate (AUGMENTIN) 875-125 MG tablet Take 1 tablet 2 (two) times daily by mouth. X 7 days 05/15/17   Domenick GongMortenson, Ashley, MD  clonazePAM (KLONOPIN) 0.5 MG tablet Take 0.5 mg by mouth 3 (three) times daily as needed.    [provider]  escitalopram (LEXAPRO) 10 MG tablet Take 10 mg by mouth daily.    [provider]  fluticasone (FLONASE) 50 MCG/ACT nasal spray Place 2 sprays daily into both nostrils. 05/15/17   Domenick GongMortenson, Ashley, MD  hydrOXYzine (ATARAX/VISTARIL) 50 MG tablet Take 1 tablet (50 mg total) 3 (three) times  daily as needed by mouth. 05/15/17   Domenick GongMortenson, Ashley, MD  montelukast (SINGULAIR) 10 MG tablet Take 1 tablet (10 mg total) by mouth at bedtime. 01/12/17   Dorena BodoKennard, Lawrence, NP  permethrin (ELIMITE) 5 % cream Apply from chin down, leave on for 8-14 hours, rinse. Repeat in 1 week 05/15/17   Domenick GongMortenson, Ashley, MD    Family History No family history on file.  Social History Social History   Tobacco Use  . Smoking status: Current Every Day Smoker    Packs/day: 1.00    Types: Cigarettes  . Smokeless tobacco: Never Used  Substance Use Topics  . Alcohol use: Yes    Comment: occasion  . Drug use: No     Allergies   Vicodin [hydrocodone-acetaminophen]   Review of Systems Review of Systems  Skin: Positive for itching and rash.  All other systems reviewed and are negative.    Physical Exam Updated Vital Signs BP (!) 136/94 (BP Location: Right Arm)   Pulse (!) 116   Temp 98.2 F (36.8 C) (Oral)   Resp 16   Ht 5\' 5"  (1.651 m)   Wt 71.2 kg (157 lb)   LMP 05/08/2017   SpO2 100%   BMI 26.13 kg/m   Physical Exam  Constitutional: She is oriented to person, place, and time. She appears well-developed and well-nourished.  HENT:  Head: Normocephalic.  Eyes: EOM  are normal.  Neck: Normal range of motion.  Pulmonary/Chest: Effort normal.  Abdominal: She exhibits no distension.  Musculoskeletal: Normal range of motion.  Neurological: She is alert and oriented to person, place, and time.  Skin: There is erythema.  Dried cracked skin hands, several erythematous areas skin   Psychiatric: She has a normal mood and affect.  Nursing note and vitals reviewed.    ED Treatments / Results  Labs (all labs ordered are listed, but only abnormal results are displayed) Labs Reviewed  COMPREHENSIVE METABOLIC PANEL - Abnormal; Notable for the following components:      Result Value   BUN 5 (*)    All other components within normal limits  CBC WITH DIFFERENTIAL/PLATELET  URINALYSIS,  ROUTINE W REFLEX MICROSCOPIC  I-STAT CG4 LACTIC ACID, ED  I-STAT BETA HCG BLOOD, ED ([facility], [facility], [facility] ONLY)  I-STAT CG4 LACTIC ACID, ED    EKG  EKG Interpretation None       Radiology No results found.  Procedures Procedures (including critical care time)  Medications Ordered in ED Medications - No data to display   Initial Impression / Assessment and Plan / ED Course  I have reviewed the triage vital signs and the nursing notes.  Pertinent labs & imaging results that were available during my care of the patient were reviewed by me and considered in my medical decision making (see chart for details).     Pt has a history of eczema and obviously has some areas of eczema. I advised pt I do not see any bugs or evidence of bugs.  doubt scabies,  No chiggers.  ( I think pt's concerns are triggered by her eczema, I hope reassuring her that she does not have an infestation is helpful)   Final Clinical Impressions(s) / ED Diagnoses   Final diagnoses:  Eczema, unspecified type    ED Discharge Orders    None    An After Visit Summary was printed and given to the patient.    Elson AreasSofia, Deveion Denz K, PA-C 05/23/17 1517    Doug SouJacubowitz, Sam, MD 05/23/17 1714

## 2017-07-06 ENCOUNTER — Other Ambulatory Visit: Payer: Self-pay

## 2017-07-06 ENCOUNTER — Ambulatory Visit (HOSPITAL_COMMUNITY)
Admission: EM | Admit: 2017-07-06 | Discharge: 2017-07-06 | Disposition: A | Discharge status: Home / Self Care | Payer: Medicaid Other | Attending: Internal Medicine | Admitting: Internal Medicine

## 2017-07-06 ENCOUNTER — Encounter (HOSPITAL_COMMUNITY): Payer: Self-pay

## 2017-07-06 DIAGNOSIS — R0789 Other chest pain: Secondary | ICD-10-CM | POA: Diagnosis not present

## 2017-07-06 DIAGNOSIS — R05 Cough: Secondary | ICD-10-CM

## 2017-07-06 DIAGNOSIS — H66001 Acute suppurative otitis media without spontaneous rupture of ear drum, right ear: Secondary | ICD-10-CM

## 2017-07-06 MED ORDER — NAPROXEN 500 MG PO TABS: 500.0000 mg | ORAL_TABLET | Freq: Two times a day (BID) | ORAL | 0 refills | Status: AC

## 2017-07-06 MED ORDER — AMOXICILLIN 500 MG PO CAPS: 500.0000 mg | ORAL_CAPSULE | Freq: Three times a day (TID) | ORAL | 0 refills | Status: AC

## 2017-07-06 NOTE — ED Provider Notes (Signed)
[facility]    CSN: [id number] Arrival date & time: 07/06/17  1925     History   Chief Complaint Chief Complaint  Patient presents with  . Ear Pain    HPI Paula [name] is a 36 y.o. female.   36 year old female, with history of anxiety, current everyday smoking, presenting today complaining of right ear pain.  States that she has had pain in her right ear as well as right-sided facial pressure and nasal congestion for the past 3 days.  She denies any associated fever or chills.  States that she has had a mild cough as well as right-sided chest pain.  Pain is reproducible on exam.  She denies any headache, neck pain or stiffness, shortness of breath, abdominal pain, nausea or vomiting.   The history is provided by the patient.  Otalgia  Location:  Right Behind ear:  No abnormality Quality:  Aching Severity:  Moderate Onset quality:  Gradual Duration:  3 days Timing:  Constant Progression:  Unchanged Chronicity:  New Context: recent URI   Context: not direct blow, not foreign body in ear, not loud noise and not water in ear   Relieved by:  Nothing Worsened by:  Nothing Ineffective treatments:  None tried Associated symptoms: congestion and cough   Associated symptoms: no abdominal pain, no diarrhea, no ear discharge, no fever, no headaches, no hearing loss, no neck pain, no rash, no rhinorrhea, no sore throat, no tinnitus and no vomiting   Risk factors: no recent travel, no chronic ear infection and no prior ear surgery     Past Medical History:  Diagnosis Date  . Anxiety     There are no active problems to display for this patient.   Past Surgical History:  Procedure Laterality Date  . CESAREAN SECTION      OB History    No data available       Home Medications    Prior to Admission medications   Medication Sig Start Date End Date Taking? Authorizing Provider  clonazePAM (KLONOPIN) 0.5 MG tablet Take 0.5 mg by mouth 3 (three) times daily  as needed.   Yes [provider]  fluticasone (FLONASE) 50 MCG/ACT nasal spray Place 2 sprays daily into both nostrils. 05/15/17  Yes Domenick GongMortenson, Ashley, MD  albuterol (PROVENTIL HFA;VENTOLIN HFA) 108 (90 BASE) MCG/ACT inhaler Inhale 2 puffs into the lungs every 6 (six) hours as needed for wheezing or shortness of breath. 09/24/14   Le, Thao P, DO  amoxicillin (AMOXIL) 500 MG capsule Take 1 capsule (500 mg total) by mouth 3 (three) times daily. 07/06/17   Brnadon Eoff C, PA-C  amoxicillin-clavulanate (AUGMENTIN) 875-125 MG tablet Take 1 tablet 2 (two) times daily by mouth. X 7 days 05/15/17   Domenick GongMortenson, Ashley, MD  escitalopram (LEXAPRO) 10 MG tablet Take 10 mg by mouth daily.    [provider]  hydrOXYzine (ATARAX/VISTARIL) 50 MG tablet Take 1 tablet (50 mg total) 3 (three) times daily as needed by mouth. 05/15/17   Domenick GongMortenson, Ashley, MD  montelukast (SINGULAIR) 10 MG tablet Take 1 tablet (10 mg total) by mouth at bedtime. 01/12/17   Dorena BodoKennard, Lawrence, NP  naproxen (NAPROSYN) 500 MG tablet Take 1 tablet (500 mg total) by mouth 2 (two) times daily. 07/06/17   Emmaly Leech C, PA-C  permethrin (ELIMITE) 5 % cream Apply from chin down, leave on for 8-14 hours, rinse. Repeat in 1 week 05/15/17   Domenick GongMortenson, Ashley, MD    Family History History  reviewed. No pertinent family history.  Social History Social History   Tobacco Use  . Smoking status: Current Every Day Smoker    Packs/day: 1.00    Types: Cigarettes  . Smokeless tobacco: Never Used  Substance Use Topics  . Alcohol use: Yes    Comment: occasion  . Drug use: No     Allergies   Vicodin [hydrocodone-acetaminophen]   Review of Systems Review of Systems  Constitutional: Negative for chills and fever.  HENT: Positive for congestion and ear pain. Negative for ear discharge, hearing loss, rhinorrhea, sore throat and tinnitus.   Eyes: Negative for pain and visual disturbance.  Respiratory: Positive for cough. Negative for  shortness of breath.   Cardiovascular: Positive for chest pain. Negative for palpitations.  Gastrointestinal: Negative for abdominal pain, diarrhea and vomiting.  Genitourinary: Negative for dysuria and hematuria.  Musculoskeletal: Negative for arthralgias, back pain and neck pain.  Skin: Negative for color change and rash.  Neurological: Negative for seizures, syncope and headaches.  All other systems reviewed and are negative.    Physical Exam Triage Vital Signs ED Triage Vitals  Enc Vitals Group     BP 07/06/17 1936 123/68     Pulse Rate 07/06/17 1936 93     Resp 07/06/17 1936 16     Temp 07/06/17 1936 98.2 F (36.8 C)     Temp Source 07/06/17 1936 Oral     SpO2 07/06/17 1936 99 %     Weight --      Height --      Head Circumference --      Peak Flow --      Pain Score 07/06/17 1937 7     Pain Loc --      Pain Edu? --      Excl. in GC? --    No data found.  Updated Vital Signs BP 123/68 (BP Location: Left Arm)   Pulse 93   Temp 98.2 F (36.8 C) (Oral)   Resp 16   LMP 07/04/2017 (Exact Date)   SpO2 99%   Visual Acuity Right Eye Distance:   Left Eye Distance:   Bilateral Distance:    Right Eye Near:   Left Eye Near:    Bilateral Near:     Physical Exam  Constitutional: She appears well-developed and well-nourished. No distress.  HENT:  Head: Normocephalic and atraumatic.  Right Ear: Hearing, external ear and ear canal normal. Tympanic membrane is erythematous and bulging.  Left Ear: Hearing, tympanic membrane, external ear and ear canal normal.  Nose: Nose normal.  Mouth/Throat: Oropharynx is clear and moist. No oropharyngeal exudate, posterior oropharyngeal edema, posterior oropharyngeal erythema or tonsillar abscesses.  Eyes: Conjunctivae are normal.  Neck: Neck supple.  Cardiovascular: Normal rate and regular rhythm.  No murmur heard. Pulmonary/Chest: Effort normal and breath sounds normal. No stridor. No respiratory distress. She has no wheezes. She  has no rhonchi. She has no rales. She exhibits tenderness.  Reproducible tenderness to palpation of the right-sided chest wall    Abdominal: Soft. There is no tenderness.  Musculoskeletal: She exhibits no edema.  Neurological: She is alert.  Skin: Skin is warm and dry.  Psychiatric: She has a normal mood and affect.  Nursing note and vitals reviewed.    UC Treatments / Results  Labs (all labs ordered are listed, but only abnormal results are displayed) Labs Reviewed - No data to display  EKG  EKG Interpretation None       Radiology No  results found.  Procedures Procedures (including critical care time)  Medications Ordered in UC Medications - No data to display   Initial Impression / Assessment and Plan / UC Course  I have reviewed the triage vital signs and the nursing notes.  Pertinent labs & imaging results that were available during my care of the patient were reviewed by me and considered in my medical decision making (see chart for details).     Right ear pain - right AOM - amox Right sided chest pain -normal EKG.  Most likely musculoskeletal pain.  Final Clinical Impressions(s) / UC Diagnoses   Final diagnoses:  Acute suppurative otitis media of right ear without spontaneous rupture of tympanic membrane, recurrence not specified  Right-sided chest wall pain    ED Discharge Orders        Ordered    amoxicillin (AMOXIL) 500 MG capsule  3 times daily     07/06/17 1950    naproxen (NAPROSYN) 500 MG tablet  2 times daily     07/06/17 1950       Controlled Substance Prescriptions [place] Controlled Substance Registry consulted? Not Applicable   Alecia Lemming, [place] 07/06/17 4098

## 2017-07-06 NOTE — ED Triage Notes (Signed)
[facility], pt also complains of chest pain behind rt breast x3 days also, pt denies any injuries

## 2017-08-01 ENCOUNTER — Encounter (HOSPITAL_COMMUNITY): Payer: Self-pay | Admitting: Emergency Medicine

## 2017-08-01 ENCOUNTER — Ambulatory Visit (HOSPITAL_COMMUNITY)
Admission: EM | Admit: 2017-08-01 | Discharge: 2017-08-01 | Disposition: A | Discharge status: Home / Self Care | Payer: Medicaid Other | Attending: Family Medicine | Admitting: Family Medicine

## 2017-08-01 ENCOUNTER — Other Ambulatory Visit: Payer: Self-pay

## 2017-08-01 ENCOUNTER — Emergency Department (HOSPITAL_COMMUNITY)
Admission: EM | Admit: 2017-08-01 | Discharge: 2017-08-01 | Disposition: A | Discharge status: Home / Self Care | Payer: Medicaid Other | Attending: Emergency Medicine | Admitting: Emergency Medicine

## 2017-08-01 DIAGNOSIS — R1011 Right upper quadrant pain: Secondary | ICD-10-CM | POA: Diagnosis not present

## 2017-08-01 DIAGNOSIS — Z5321 Procedure and treatment not carried out due to patient leaving prior to being seen by health care provider: Secondary | ICD-10-CM | POA: Diagnosis not present

## 2017-08-01 DIAGNOSIS — R1084 Generalized abdominal pain: Secondary | ICD-10-CM | POA: Diagnosis present

## 2017-08-01 LAB — URINALYSIS, ROUTINE W REFLEX MICROSCOPIC
Bacteria, UA: NONE SEEN
GLUCOSE, UA: NEGATIVE mg/dL
KETONES UR: NEGATIVE mg/dL
LEUKOCYTES UA: NEGATIVE
Nitrite: NEGATIVE
PH: 5 (ref 5.0–8.0)
Protein, ur: NEGATIVE mg/dL
Specific Gravity, Urine: 1.033 — ABNORMAL HIGH (ref 1.005–1.030)

## 2017-08-01 LAB — LIPASE, BLOOD: LIPASE: 26 U/L (ref 11–51)

## 2017-08-01 LAB — I-STAT BETA HCG BLOOD, ED (MC, WL, AP ONLY): I-stat hCG, quantitative: <5 m[IU]/mL (ref <5)

## 2017-08-01 LAB — COMPREHENSIVE METABOLIC PANEL
ALK PHOS: 56 U/L (ref 38–126)
ALT: 22 U/L (ref 14–54)
AST: 21 U/L (ref 15–41)
Albumin: 4.1 g/dL (ref 3.5–5.0)
Anion gap: 12 (ref 5–15)
BUN: 13 mg/dL (ref 6–20)
CHLORIDE: 102 mmol/L (ref 101–111)
CO2: 24 mmol/L (ref 22–32)
CREATININE: 0.85 mg/dL (ref 0.44–1.00)
Calcium: 9.2 mg/dL (ref 8.9–10.3)
Glucose, Bld: 98 mg/dL (ref 65–99)
Potassium: 3.5 mmol/L (ref 3.5–5.1)
SODIUM: 138 mmol/L (ref 135–145)
Total Bilirubin: 0.4 mg/dL (ref 0.3–1.2)
Total Protein: 6.7 g/dL (ref 6.5–8.1)

## 2017-08-01 LAB — CBC
HCT: 38.8 % (ref 36.0–46.0)
HEMOGLOBIN: 12.7 g/dL (ref 12.0–15.0)
MCH: 30.5 pg (ref 26.0–34.0)
MCHC: 32.7 g/dL (ref 30.0–36.0)
MCV: 93 fL (ref 78.0–100.0)
PLATELETS: 313 10*3/uL (ref 150–400)
RBC: 4.17 MIL/uL (ref 3.87–5.11)
RDW: 14.7 % (ref 11.5–15.5)
WBC: 13 10*3/uL — AB (ref 4.0–10.5)

## 2017-08-01 NOTE — ED Notes (Signed)
Pt states she is leaving and will come back tomorrow if needed.

## 2017-08-01 NOTE — ED Triage Notes (Signed)
Pt in c/o abdominal cramping that started around 6p tonight, recent history of same, reports she is on her menstrual cycle, sent over from urgent care for further evaluation- denies n/v/d

## 2017-08-01 NOTE — ED Notes (Signed)
Pt remains in waiting room. Updated on wait for treatment room.  Pt wanting to leave due to wait.  Encouraged pt to stay and explained process.

## 2017-08-01 NOTE — ED Triage Notes (Signed)
Abdominal pain started 2 night ago, intermittent.  Vomited Sunday.  Reports feeling fine Monday and Tuesday.  Patient reports this evening had sudden onset of abdominal cramping, right upper quadrant pain, pain into right shoulder pain.  Patient has had nausea with this

## 2017-08-01 NOTE — ED Provider Notes (Signed)
[facility]    CSN: [id number] Arrival date & time: 08/01/17  1946     History   Chief Complaint Chief Complaint  Patient presents with  . Abdominal Pain    HPI Paula [name] is a 37 y.o. female.   37 year old female, with history of anxiety, presenting today complaining of right upper quadrant abdominal pain.  States that her pain started Friday.  States that her pain lasted for several hours and then resolved on its own.  States that she had vomiting and diarrhea on Saturday and Sunday.  States that her pain returned tonight about 2 hours prior to arrival.  She had some associated nausea without vomiting.  States the pain radiates to the right side of her mid back as well as her right shoulder.  Increased pain with deep breathing and movement.   The history is provided by the patient.  Abdominal Pain  Pain location:  RUQ Pain quality: aching   Pain radiates to:  R flank and R shoulder Pain severity:  Moderate Onset quality:  Gradual Duration:  5 days Timing:  Intermittent Progression:  Unchanged Chronicity:  New Context: not alcohol use, not awakening from sleep, not diet changes and not eating   Relieved by:  Nothing Worsened by:  Palpation and movement Ineffective treatments:  Lying down Associated symptoms: diarrhea, nausea and vomiting   Associated symptoms: no chest pain, no chills, no cough, no dysuria, no fatigue, no fever, no flatus, no hematemesis, no hematochezia, no hematuria, no shortness of breath and no sore throat   Risk factors: no alcohol abuse, no aspirin use, not elderly, has not had multiple surgeries, no NSAID use and not obese     Past Medical History:  Diagnosis Date  . Anxiety     There are no active problems to display for this patient.   Past Surgical History:  Procedure Laterality Date  . CESAREAN SECTION      OB History    No data available       Home Medications    Prior to Admission medications   Medication  Sig Start Date End Date Taking? Authorizing Provider  clonazePAM (KLONOPIN) 0.5 MG tablet Take 0.5 mg by mouth 3 (three) times daily as needed.   Yes [provider]  albuterol (PROVENTIL HFA;VENTOLIN HFA) 108 (90 BASE) MCG/ACT inhaler Inhale 2 puffs into the lungs every 6 (six) hours as needed for wheezing or shortness of breath. 09/24/14   Le, Thao P, DO  amoxicillin (AMOXIL) 500 MG capsule Take 1 capsule (500 mg total) by mouth 3 (three) times daily. 07/06/17   Blue, Olivia C, PA-C  amoxicillin-clavulanate (AUGMENTIN) 875-125 MG tablet Take 1 tablet 2 (two) times daily by mouth. X 7 days 05/15/17   Domenick Gong, MD  escitalopram (LEXAPRO) 10 MG tablet Take 10 mg by mouth daily.    [provider]  fluticasone (FLONASE) 50 MCG/ACT nasal spray Place 2 sprays daily into both nostrils. 05/15/17   Domenick Gong, MD  hydrOXYzine (ATARAX/VISTARIL) 50 MG tablet Take 1 tablet (50 mg total) 3 (three) times daily as needed by mouth. 05/15/17   Domenick Gong, MD  montelukast (SINGULAIR) 10 MG tablet Take 1 tablet (10 mg total) by mouth at bedtime. 01/12/17   Dorena Bodo, NP  naproxen (NAPROSYN) 500 MG tablet Take 1 tablet (500 mg total) by mouth 2 (two) times daily. 07/06/17   Blue, Olivia C, PA-C  permethrin (ELIMITE) 5 % cream Apply from chin down, leave on  for 8-14 hours, rinse. Repeat in 1 week 05/15/17   Domenick GongMortenson, Ashley, MD    Family History Family History  Problem Relation Age of Onset  . CAD Mother     Social History Social History   Tobacco Use  . Smoking status: Current Every Day Smoker    Packs/day: 1.00    Types: Cigarettes  . Smokeless tobacco: Never Used  Substance Use Topics  . Alcohol use: Yes    Comment: occasion  . Drug use: No     Allergies   Vicodin [hydrocodone-acetaminophen]   Review of Systems Review of Systems  Constitutional: Negative for chills, fatigue and fever.  HENT: Negative for ear pain and sore throat.   Eyes: Negative  for pain and visual disturbance.  Respiratory: Negative for cough and shortness of breath.   Cardiovascular: Negative for chest pain and palpitations.  Gastrointestinal: Positive for abdominal pain, diarrhea, nausea and vomiting. Negative for flatus, hematemesis and hematochezia.  Genitourinary: Negative for dysuria and hematuria.  Musculoskeletal: Negative for arthralgias and back pain.  Skin: Negative for color change and rash.  Neurological: Negative for seizures and syncope.  All other systems reviewed and are negative.    Physical Exam Triage Vital Signs ED Triage Vitals  Enc Vitals Group     BP 08/01/17 2003 128/76     Pulse Rate 08/01/17 2003 80     Resp 08/01/17 2003 18     Temp 08/01/17 2003 97.6 F (36.4 C)     Temp Source 08/01/17 2003 Oral     SpO2 08/01/17 2003 100 %     Weight --      Height --      Head Circumference --      Peak Flow --      Pain Score 08/01/17 1959 8     Pain Loc --      Pain Edu? --      Excl. in GC? --    No data found.  Updated Vital Signs BP 128/76 (BP Location: Right Arm)   Pulse 80   Temp 97.6 F (36.4 C) (Oral)   Resp 18   LMP 06/28/2017   SpO2 100%   Visual Acuity Right Eye Distance:   Left Eye Distance:   Bilateral Distance:    Right Eye Near:   Left Eye Near:    Bilateral Near:     Physical Exam  Constitutional: She appears well-developed and well-nourished. No distress.  HENT:  Head: Normocephalic and atraumatic.  Eyes: Conjunctivae are normal.  Neck: Neck supple.  Cardiovascular: Normal rate and regular rhythm.  No murmur heard. Pulmonary/Chest: Effort normal and breath sounds normal. No respiratory distress.  Abdominal: Soft. There is tenderness in the right upper quadrant. There is positive Murphy's sign.  Right upper quadrant tenderness on exam.  Patient does have a positive Murphy sign  Musculoskeletal: She exhibits no edema.  Neurological: She is alert.  Skin: Skin is warm and dry.  Psychiatric: She  has a normal mood and affect.  Nursing note and vitals reviewed.    UC Treatments / Results  Labs (all labs ordered are listed, but only abnormal results are displayed) Labs Reviewed - No data to display  EKG  EKG Interpretation None       Radiology No results found.  Procedures Procedures (including critical care time)  Medications Ordered in UC Medications - No data to display   Initial Impression / Assessment and Plan / UC Course  I have reviewed the  triage vital signs and the nursing notes.  Pertinent labs & imaging results that were available during my care of the patient were reviewed by me and considered in my medical decision making (see chart for details).     5 days of intermittent right upper quadrant pain with associated nausea, vomiting and diarrhea.  Patient is very tender on exam with positive Murphy sign.  Will be sent to the emergency department to rule out cholecystitis  Final Clinical Impressions(s) / UC Diagnoses   Final diagnoses:  Abdominal pain, RUQ    ED Discharge Orders    None       Controlled Substance Prescriptions [place] Controlled Substance Registry consulted? Not Applicable   Alecia Lemming, [place] 08/01/17 2014

## 2017-08-01 NOTE — ED Notes (Signed)
Pt further describes pain as epigastric and radiates into her shoulder blades at times

## 2017-08-03 NOTE — ED Notes (Signed)
Called pt. For follow-up, no answer, unable to leave a message
